# Patient Record
Sex: Male | Born: 1955
Health system: Southern US, Community
[De-identification: ages and names within clinical notes are randomized; demographics above are authoritative.]

## PROBLEM LIST (undated history)

## (undated) DIAGNOSIS — M549 Dorsalgia, unspecified: Secondary | ICD-10-CM

## (undated) DIAGNOSIS — G4700 Insomnia, unspecified: Secondary | ICD-10-CM

## (undated) DIAGNOSIS — M1990 Unspecified osteoarthritis, unspecified site: Secondary | ICD-10-CM

## (undated) DIAGNOSIS — F17200 Nicotine dependence, unspecified, uncomplicated: Secondary | ICD-10-CM

## (undated) DIAGNOSIS — R6881 Early satiety: Secondary | ICD-10-CM

## (undated) DIAGNOSIS — R739 Hyperglycemia, unspecified: Secondary | ICD-10-CM

## (undated) DIAGNOSIS — Z86711 Personal history of pulmonary embolism: Secondary | ICD-10-CM

## (undated) DIAGNOSIS — R51 Headache: Secondary | ICD-10-CM

## (undated) DIAGNOSIS — R609 Edema, unspecified: Secondary | ICD-10-CM

## (undated) DIAGNOSIS — R635 Abnormal weight gain: Secondary | ICD-10-CM

## (undated) DIAGNOSIS — G8929 Other chronic pain: Secondary | ICD-10-CM

## (undated) DIAGNOSIS — N400 Enlarged prostate without lower urinary tract symptoms: Secondary | ICD-10-CM

## (undated) DIAGNOSIS — G629 Polyneuropathy, unspecified: Secondary | ICD-10-CM

## (undated) DIAGNOSIS — R634 Abnormal weight loss: Secondary | ICD-10-CM

## (undated) DIAGNOSIS — D72829 Elevated white blood cell count, unspecified: Secondary | ICD-10-CM

## (undated) DIAGNOSIS — B3781 Candidal esophagitis: Secondary | ICD-10-CM

## (undated) DIAGNOSIS — Z227 Latent tuberculosis: Secondary | ICD-10-CM

## (undated) DIAGNOSIS — R519 Headache, unspecified: Secondary | ICD-10-CM

## (undated) DIAGNOSIS — R61 Generalized hyperhidrosis: Secondary | ICD-10-CM

## (undated) DIAGNOSIS — R7309 Other abnormal glucose: Secondary | ICD-10-CM

## (undated) HISTORY — PX: HERNIA REPAIR: SHX51

## (undated) HISTORY — PX: DG SHOULDER RIGHT COMPLETE: HXRAD1612

## (undated) HISTORY — DX: Elevated white blood cell count, unspecified: D72.829

## (undated) HISTORY — PX: HAND SURGERY: SHX662

## (undated) HISTORY — PX: WISDOM TOOTH EXTRACTION: SHX21

## (undated) HISTORY — PX: BRAIN SURGERY: SHX531

## (undated) HISTORY — DX: Abnormal weight loss: R63.4

## (undated) HISTORY — PX: BACK SURGERY: SHX140

## (undated) HISTORY — DX: Abnormal weight gain: R63.5

## (undated) HISTORY — PX: CERVICAL FUSION: SHX112

## (undated) HISTORY — PX: PROSTATE SURGERY: SHX751

## (undated) HISTORY — DX: Early satiety: R68.81

## (undated) HISTORY — DX: Headache, unspecified: R51.9

## (undated) HISTORY — DX: Hyperglycemia, unspecified: R73.9

## (undated) HISTORY — DX: Benign prostatic hyperplasia without lower urinary tract symptoms: N40.0

## (undated) HISTORY — DX: Headache: R51

## (undated) HISTORY — DX: Latent tuberculosis: Z22.7

## (undated) HISTORY — DX: Other abnormal glucose: R73.09

## (undated) HISTORY — PX: KNEE ARTHROSCOPY: SHX127

## (undated) HISTORY — DX: Insomnia, unspecified: G47.00

## (undated) HISTORY — DX: Dorsalgia, unspecified: M54.9

## (undated) HISTORY — DX: Generalized hyperhidrosis: R61

## (undated) HISTORY — DX: Edema, unspecified: R60.9

## (undated) HISTORY — DX: Polyneuropathy, unspecified: G62.9

## (undated) HISTORY — DX: Candidal esophagitis: B37.81

## (undated) HISTORY — DX: Nicotine dependence, unspecified, uncomplicated: F17.200

## (undated) HISTORY — DX: Other chronic pain: G89.29

---

## 2005-10-05 ENCOUNTER — Ambulatory Visit: Payer: Self-pay | Admitting: Internal Medicine

## 2005-10-23 ENCOUNTER — Ambulatory Visit: Payer: Self-pay | Admitting: Internal Medicine

## 2010-04-15 ENCOUNTER — Ambulatory Visit: Payer: Self-pay | Admitting: Family Medicine

## 2011-05-20 ENCOUNTER — Other Ambulatory Visit: Payer: Self-pay | Admitting: Psychiatry

## 2011-05-20 DIAGNOSIS — G970 Cerebrospinal fluid leak from spinal puncture: Secondary | ICD-10-CM

## 2011-06-24 ENCOUNTER — Ambulatory Visit
Admission: RE | Admit: 2011-06-24 | Discharge: 2011-06-24 | Disposition: A | Discharge status: Home / Self Care | Payer: Medicare Other | Source: Ambulatory Visit | Attending: Psychiatry | Admitting: Psychiatry

## 2011-06-24 DIAGNOSIS — G970 Cerebrospinal fluid leak from spinal puncture: Secondary | ICD-10-CM

## 2011-06-24 MED ORDER — GADOBENATE DIMEGLUMINE 529 MG/ML IV SOLN
17.0000 mL | Freq: Once | INTRAVENOUS | Status: AC | PRN
  Administered 2011-06-24: 17 mL via INTRAVENOUS

## 2011-09-01 DIAGNOSIS — M961 Postlaminectomy syndrome, not elsewhere classified: Secondary | ICD-10-CM | POA: Insufficient documentation

## 2011-11-18 DIAGNOSIS — M1710 Unilateral primary osteoarthritis, unspecified knee: Secondary | ICD-10-CM | POA: Insufficient documentation

## 2012-07-17 DIAGNOSIS — G44009 Cluster headache syndrome, unspecified, not intractable: Secondary | ICD-10-CM | POA: Insufficient documentation

## 2012-09-29 DIAGNOSIS — M549 Dorsalgia, unspecified: Secondary | ICD-10-CM | POA: Insufficient documentation

## 2012-09-29 DIAGNOSIS — G8929 Other chronic pain: Secondary | ICD-10-CM | POA: Insufficient documentation

## 2012-09-29 DIAGNOSIS — G4700 Insomnia, unspecified: Secondary | ICD-10-CM | POA: Insufficient documentation

## 2012-10-04 DIAGNOSIS — E291 Testicular hypofunction: Secondary | ICD-10-CM | POA: Insufficient documentation

## 2012-10-08 DIAGNOSIS — M546 Pain in thoracic spine: Secondary | ICD-10-CM | POA: Insufficient documentation

## 2013-05-29 ENCOUNTER — Ambulatory Visit: Payer: Self-pay | Admitting: Pain Medicine

## 2013-09-25 DIAGNOSIS — Z79899 Other long term (current) drug therapy: Secondary | ICD-10-CM | POA: Insufficient documentation

## 2013-09-25 DIAGNOSIS — Z79891 Long term (current) use of opiate analgesic: Secondary | ICD-10-CM | POA: Insufficient documentation

## 2013-09-27 ENCOUNTER — Emergency Department: Payer: Self-pay | Admitting: Emergency Medicine

## 2013-09-28 ENCOUNTER — Emergency Department: Payer: Self-pay | Admitting: Emergency Medicine

## 2013-10-01 ENCOUNTER — Emergency Department: Payer: Self-pay | Admitting: Emergency Medicine

## 2013-10-01 LAB — URINALYSIS, COMPLETE
Bacteria: NONE SEEN
Bilirubin,UR: NEGATIVE
Blood: NEGATIVE
Glucose,UR: 50 mg/dL (ref 0–75)
Hyaline Cast: 3
Ketone: NEGATIVE
Leukocyte Esterase: NEGATIVE
Nitrite: NEGATIVE
Ph: 5 (ref 4.5–8.0)
Protein: 30
RBC,UR: 4 /HPF (ref 0–5)
Specific Gravity: 1.029 (ref 1.003–1.030)
Squamous Epithelial: NONE SEEN
WBC UR: 11 /HPF (ref 0–5)

## 2013-10-01 LAB — COMPREHENSIVE METABOLIC PANEL WITH GFR
Albumin: 3.6 g/dL
Alkaline Phosphatase: 57 U/L
Anion Gap: 7
BUN: 21 mg/dL — ABNORMAL HIGH
Bilirubin,Total: 0.4 mg/dL
Calcium, Total: 9.2 mg/dL
Chloride: 107 mmol/L
Co2: 23 mmol/L
Creatinine: 1 mg/dL
EGFR (African American): >60
EGFR (Non-African Amer.): >60
Glucose: 155 mg/dL — ABNORMAL HIGH
Osmolality: 280
Potassium: 4.2 mmol/L
SGOT(AST): 34 U/L
SGPT (ALT): 59 U/L
Sodium: 137 mmol/L
Total Protein: 6.8 g/dL

## 2013-10-01 LAB — CBC
HCT: 44.6 %
HGB: 15.6 g/dL
MCH: 34.5 pg — ABNORMAL HIGH
MCHC: 35 g/dL
MCV: 99 fL
Platelet: 211 x10 3/mm 3
RBC: 4.51 x10 6/mm 3
RDW: 14.6 % — ABNORMAL HIGH
WBC: 10.3 x10 3/mm 3

## 2013-10-01 LAB — LIPASE, BLOOD: Lipase: 165 U/L

## 2013-10-03 LAB — URINE CULTURE

## 2013-10-26 ENCOUNTER — Ambulatory Visit: Payer: Self-pay | Admitting: Gastroenterology

## 2013-11-01 DIAGNOSIS — B3781 Candidal esophagitis: Secondary | ICD-10-CM | POA: Insufficient documentation

## 2013-11-01 DIAGNOSIS — R739 Hyperglycemia, unspecified: Secondary | ICD-10-CM

## 2013-11-01 DIAGNOSIS — D72829 Elevated white blood cell count, unspecified: Secondary | ICD-10-CM | POA: Insufficient documentation

## 2013-11-01 DIAGNOSIS — R7309 Other abnormal glucose: Secondary | ICD-10-CM | POA: Insufficient documentation

## 2013-11-01 DIAGNOSIS — H519 Unspecified disorder of binocular movement: Secondary | ICD-10-CM | POA: Insufficient documentation

## 2013-11-01 HISTORY — DX: Hyperglycemia, unspecified: R73.9

## 2013-11-01 HISTORY — DX: Other abnormal glucose: R73.09

## 2013-11-01 HISTORY — DX: Candidal esophagitis: B37.81

## 2013-11-01 HISTORY — DX: Elevated white blood cell count, unspecified: D72.829

## 2013-11-20 ENCOUNTER — Ambulatory Visit: Payer: Self-pay | Admitting: Pain Medicine

## 2013-11-21 ENCOUNTER — Ambulatory Visit: Payer: Self-pay | Admitting: Pain Medicine

## 2013-12-22 ENCOUNTER — Ambulatory Visit: Payer: Self-pay | Admitting: Pain Medicine

## 2014-01-02 ENCOUNTER — Ambulatory Visit: Payer: Self-pay | Admitting: Pain Medicine

## 2014-01-10 ENCOUNTER — Ambulatory Visit: Payer: Self-pay | Admitting: Ophthalmology

## 2014-01-17 ENCOUNTER — Ambulatory Visit: Payer: Self-pay | Admitting: Pain Medicine

## 2014-01-18 ENCOUNTER — Other Ambulatory Visit: Payer: Self-pay | Admitting: Pain Medicine

## 2014-02-09 ENCOUNTER — Ambulatory Visit: Payer: Self-pay | Admitting: Pain Medicine

## 2014-03-23 ENCOUNTER — Ambulatory Visit: Payer: Self-pay | Admitting: Pain Medicine

## 2014-03-28 DIAGNOSIS — R609 Edema, unspecified: Secondary | ICD-10-CM | POA: Insufficient documentation

## 2014-03-28 DIAGNOSIS — M2410 Other articular cartilage disorders, unspecified site: Secondary | ICD-10-CM | POA: Insufficient documentation

## 2014-03-28 DIAGNOSIS — S83289A Other tear of lateral meniscus, current injury, unspecified knee, initial encounter: Secondary | ICD-10-CM | POA: Insufficient documentation

## 2014-03-28 HISTORY — DX: Edema, unspecified: R60.9

## 2014-04-09 DIAGNOSIS — Z9229 Personal history of other drug therapy: Secondary | ICD-10-CM | POA: Insufficient documentation

## 2014-04-09 DIAGNOSIS — I999 Unspecified disorder of circulatory system: Secondary | ICD-10-CM | POA: Insufficient documentation

## 2014-04-12 DIAGNOSIS — M47816 Spondylosis without myelopathy or radiculopathy, lumbar region: Secondary | ICD-10-CM | POA: Insufficient documentation

## 2014-04-12 DIAGNOSIS — G894 Chronic pain syndrome: Secondary | ICD-10-CM | POA: Insufficient documentation

## 2014-04-12 DIAGNOSIS — M5417 Radiculopathy, lumbosacral region: Secondary | ICD-10-CM | POA: Insufficient documentation

## 2014-04-12 DIAGNOSIS — Z9889 Other specified postprocedural states: Secondary | ICD-10-CM | POA: Insufficient documentation

## 2014-04-12 DIAGNOSIS — M961 Postlaminectomy syndrome, not elsewhere classified: Secondary | ICD-10-CM | POA: Insufficient documentation

## 2014-04-18 ENCOUNTER — Ambulatory Visit: Payer: Self-pay | Admitting: Pain Medicine

## 2014-07-26 DIAGNOSIS — M129 Arthropathy, unspecified: Secondary | ICD-10-CM | POA: Insufficient documentation

## 2014-08-02 DIAGNOSIS — K429 Umbilical hernia without obstruction or gangrene: Secondary | ICD-10-CM | POA: Insufficient documentation

## 2014-08-02 DIAGNOSIS — Q792 Exomphalos: Secondary | ICD-10-CM | POA: Insufficient documentation

## 2014-10-22 DIAGNOSIS — F32A Depression, unspecified: Secondary | ICD-10-CM | POA: Insufficient documentation

## 2014-10-22 DIAGNOSIS — F329 Major depressive disorder, single episode, unspecified: Secondary | ICD-10-CM | POA: Insufficient documentation

## 2015-02-06 ENCOUNTER — Ambulatory Visit: Payer: Self-pay | Admitting: Ophthalmology

## 2015-04-15 DIAGNOSIS — N6320 Unspecified lump in the left breast, unspecified quadrant: Secondary | ICD-10-CM | POA: Insufficient documentation

## 2015-09-25 DIAGNOSIS — E2749 Other adrenocortical insufficiency: Secondary | ICD-10-CM | POA: Insufficient documentation

## 2015-12-23 DIAGNOSIS — G44029 Chronic cluster headache, not intractable: Secondary | ICD-10-CM | POA: Diagnosis not present

## 2015-12-23 DIAGNOSIS — G894 Chronic pain syndrome: Secondary | ICD-10-CM | POA: Diagnosis not present

## 2015-12-23 DIAGNOSIS — M545 Low back pain: Secondary | ICD-10-CM | POA: Diagnosis not present

## 2015-12-23 DIAGNOSIS — M542 Cervicalgia: Secondary | ICD-10-CM | POA: Diagnosis not present

## 2016-01-06 DIAGNOSIS — G894 Chronic pain syndrome: Secondary | ICD-10-CM | POA: Diagnosis not present

## 2016-01-06 DIAGNOSIS — F1120 Opioid dependence, uncomplicated: Secondary | ICD-10-CM | POA: Diagnosis not present

## 2016-01-06 DIAGNOSIS — M545 Low back pain: Secondary | ICD-10-CM | POA: Diagnosis not present

## 2016-01-06 DIAGNOSIS — R51 Headache: Secondary | ICD-10-CM | POA: Diagnosis not present

## 2016-01-06 DIAGNOSIS — M542 Cervicalgia: Secondary | ICD-10-CM | POA: Diagnosis not present

## 2016-01-06 DIAGNOSIS — G890 Central pain syndrome: Secondary | ICD-10-CM | POA: Diagnosis not present

## 2016-01-06 DIAGNOSIS — Z79891 Long term (current) use of opiate analgesic: Secondary | ICD-10-CM | POA: Diagnosis not present

## 2016-01-21 DIAGNOSIS — G44029 Chronic cluster headache, not intractable: Secondary | ICD-10-CM | POA: Diagnosis not present

## 2016-01-21 DIAGNOSIS — M545 Low back pain: Secondary | ICD-10-CM | POA: Diagnosis not present

## 2016-01-21 DIAGNOSIS — F1120 Opioid dependence, uncomplicated: Secondary | ICD-10-CM | POA: Diagnosis not present

## 2016-01-21 DIAGNOSIS — G890 Central pain syndrome: Secondary | ICD-10-CM | POA: Diagnosis not present

## 2016-01-21 DIAGNOSIS — R51 Headache: Secondary | ICD-10-CM | POA: Diagnosis not present

## 2016-01-21 DIAGNOSIS — Z79891 Long term (current) use of opiate analgesic: Secondary | ICD-10-CM | POA: Diagnosis not present

## 2016-01-21 DIAGNOSIS — G894 Chronic pain syndrome: Secondary | ICD-10-CM | POA: Diagnosis not present

## 2016-01-21 DIAGNOSIS — M542 Cervicalgia: Secondary | ICD-10-CM | POA: Diagnosis not present

## 2016-01-22 DIAGNOSIS — G44029 Chronic cluster headache, not intractable: Secondary | ICD-10-CM | POA: Diagnosis not present

## 2016-01-22 DIAGNOSIS — M1990 Unspecified osteoarthritis, unspecified site: Secondary | ICD-10-CM | POA: Insufficient documentation

## 2016-01-22 DIAGNOSIS — G629 Polyneuropathy, unspecified: Secondary | ICD-10-CM | POA: Diagnosis not present

## 2016-01-22 DIAGNOSIS — F1120 Opioid dependence, uncomplicated: Secondary | ICD-10-CM | POA: Diagnosis not present

## 2016-01-22 DIAGNOSIS — G4719 Other hypersomnia: Secondary | ICD-10-CM | POA: Diagnosis not present

## 2016-01-22 DIAGNOSIS — G4700 Insomnia, unspecified: Secondary | ICD-10-CM | POA: Diagnosis not present

## 2016-01-22 DIAGNOSIS — E291 Testicular hypofunction: Secondary | ICD-10-CM | POA: Diagnosis not present

## 2016-01-25 DIAGNOSIS — F17200 Nicotine dependence, unspecified, uncomplicated: Secondary | ICD-10-CM | POA: Diagnosis not present

## 2016-01-25 DIAGNOSIS — B349 Viral infection, unspecified: Secondary | ICD-10-CM | POA: Diagnosis not present

## 2016-01-25 DIAGNOSIS — E119 Type 2 diabetes mellitus without complications: Secondary | ICD-10-CM | POA: Diagnosis not present

## 2016-01-25 DIAGNOSIS — R61 Generalized hyperhidrosis: Secondary | ICD-10-CM | POA: Diagnosis not present

## 2016-01-25 DIAGNOSIS — Z86718 Personal history of other venous thrombosis and embolism: Secondary | ICD-10-CM | POA: Diagnosis not present

## 2016-01-25 DIAGNOSIS — G960 Cerebrospinal fluid leak: Secondary | ICD-10-CM | POA: Diagnosis not present

## 2016-02-04 DIAGNOSIS — M542 Cervicalgia: Secondary | ICD-10-CM | POA: Diagnosis not present

## 2016-02-04 DIAGNOSIS — G894 Chronic pain syndrome: Secondary | ICD-10-CM | POA: Diagnosis not present

## 2016-02-04 DIAGNOSIS — Z79891 Long term (current) use of opiate analgesic: Secondary | ICD-10-CM | POA: Diagnosis not present

## 2016-02-04 DIAGNOSIS — M545 Low back pain: Secondary | ICD-10-CM | POA: Diagnosis not present

## 2016-02-04 DIAGNOSIS — G44029 Chronic cluster headache, not intractable: Secondary | ICD-10-CM | POA: Diagnosis not present

## 2016-02-13 DIAGNOSIS — R1013 Epigastric pain: Secondary | ICD-10-CM | POA: Diagnosis not present

## 2016-02-13 DIAGNOSIS — Z23 Encounter for immunization: Secondary | ICD-10-CM | POA: Diagnosis not present

## 2016-02-13 DIAGNOSIS — M2550 Pain in unspecified joint: Secondary | ICD-10-CM | POA: Diagnosis not present

## 2016-02-13 DIAGNOSIS — G609 Hereditary and idiopathic neuropathy, unspecified: Secondary | ICD-10-CM | POA: Diagnosis not present

## 2016-02-13 DIAGNOSIS — G8929 Other chronic pain: Secondary | ICD-10-CM | POA: Diagnosis not present

## 2016-02-13 DIAGNOSIS — R899 Unspecified abnormal finding in specimens from other organs, systems and tissues: Secondary | ICD-10-CM | POA: Diagnosis not present

## 2016-02-13 DIAGNOSIS — I10 Essential (primary) hypertension: Secondary | ICD-10-CM | POA: Diagnosis not present

## 2016-02-13 DIAGNOSIS — R6881 Early satiety: Secondary | ICD-10-CM | POA: Diagnosis not present

## 2016-03-03 DIAGNOSIS — M79661 Pain in right lower leg: Secondary | ICD-10-CM | POA: Diagnosis not present

## 2016-03-03 DIAGNOSIS — G894 Chronic pain syndrome: Secondary | ICD-10-CM | POA: Diagnosis not present

## 2016-03-03 DIAGNOSIS — M79662 Pain in left lower leg: Secondary | ICD-10-CM | POA: Diagnosis not present

## 2016-03-03 DIAGNOSIS — Z79891 Long term (current) use of opiate analgesic: Secondary | ICD-10-CM | POA: Diagnosis not present

## 2016-03-03 DIAGNOSIS — M545 Low back pain: Secondary | ICD-10-CM | POA: Diagnosis not present

## 2016-03-05 DIAGNOSIS — R200 Anesthesia of skin: Secondary | ICD-10-CM | POA: Insufficient documentation

## 2016-03-05 DIAGNOSIS — R202 Paresthesia of skin: Secondary | ICD-10-CM

## 2016-03-05 DIAGNOSIS — M545 Low back pain: Secondary | ICD-10-CM | POA: Diagnosis not present

## 2016-03-05 DIAGNOSIS — G8929 Other chronic pain: Secondary | ICD-10-CM | POA: Diagnosis not present

## 2016-03-05 DIAGNOSIS — E1140 Type 2 diabetes mellitus with diabetic neuropathy, unspecified: Secondary | ICD-10-CM | POA: Insufficient documentation

## 2016-03-16 DIAGNOSIS — R110 Nausea: Secondary | ICD-10-CM | POA: Diagnosis not present

## 2016-03-16 DIAGNOSIS — J189 Pneumonia, unspecified organism: Secondary | ICD-10-CM | POA: Diagnosis not present

## 2016-03-17 DIAGNOSIS — R6881 Early satiety: Secondary | ICD-10-CM | POA: Diagnosis not present

## 2016-03-17 DIAGNOSIS — R110 Nausea: Secondary | ICD-10-CM | POA: Diagnosis not present

## 2016-03-17 DIAGNOSIS — R140 Abdominal distension (gaseous): Secondary | ICD-10-CM | POA: Diagnosis not present

## 2016-03-25 DIAGNOSIS — R5383 Other fatigue: Secondary | ICD-10-CM | POA: Insufficient documentation

## 2016-03-25 DIAGNOSIS — M1990 Unspecified osteoarthritis, unspecified site: Secondary | ICD-10-CM | POA: Diagnosis not present

## 2016-03-25 DIAGNOSIS — Z201 Contact with and (suspected) exposure to tuberculosis: Secondary | ICD-10-CM | POA: Diagnosis not present

## 2016-03-25 DIAGNOSIS — R5382 Chronic fatigue, unspecified: Secondary | ICD-10-CM | POA: Diagnosis not present

## 2016-03-26 DIAGNOSIS — R1013 Epigastric pain: Secondary | ICD-10-CM | POA: Diagnosis not present

## 2016-03-26 DIAGNOSIS — J189 Pneumonia, unspecified organism: Secondary | ICD-10-CM | POA: Diagnosis not present

## 2016-03-26 DIAGNOSIS — G8929 Other chronic pain: Secondary | ICD-10-CM | POA: Diagnosis not present

## 2016-03-26 DIAGNOSIS — R110 Nausea: Secondary | ICD-10-CM | POA: Diagnosis not present

## 2016-03-26 DIAGNOSIS — M2550 Pain in unspecified joint: Secondary | ICD-10-CM | POA: Diagnosis not present

## 2016-03-30 DIAGNOSIS — G894 Chronic pain syndrome: Secondary | ICD-10-CM | POA: Diagnosis not present

## 2016-03-30 DIAGNOSIS — Z79891 Long term (current) use of opiate analgesic: Secondary | ICD-10-CM | POA: Diagnosis not present

## 2016-03-30 DIAGNOSIS — M545 Low back pain: Secondary | ICD-10-CM | POA: Diagnosis not present

## 2016-03-30 DIAGNOSIS — M79662 Pain in left lower leg: Secondary | ICD-10-CM | POA: Diagnosis not present

## 2016-03-30 DIAGNOSIS — M79661 Pain in right lower leg: Secondary | ICD-10-CM | POA: Diagnosis not present

## 2016-03-31 ENCOUNTER — Emergency Department
Admission: EM | Admit: 2016-03-31 | Discharge: 2016-03-31 | Disposition: A | Discharge status: Home / Self Care | Payer: PPO | Attending: Emergency Medicine | Admitting: Emergency Medicine

## 2016-03-31 ENCOUNTER — Encounter: Payer: Self-pay | Admitting: Emergency Medicine

## 2016-03-31 DIAGNOSIS — R700 Elevated erythrocyte sedimentation rate: Secondary | ICD-10-CM | POA: Diagnosis not present

## 2016-03-31 DIAGNOSIS — F1193 Opioid use, unspecified with withdrawal: Secondary | ICD-10-CM | POA: Diagnosis not present

## 2016-03-31 DIAGNOSIS — F1123 Opioid dependence with withdrawal: Secondary | ICD-10-CM | POA: Diagnosis not present

## 2016-03-31 DIAGNOSIS — M2550 Pain in unspecified joint: Secondary | ICD-10-CM | POA: Insufficient documentation

## 2016-03-31 DIAGNOSIS — M1990 Unspecified osteoarthritis, unspecified site: Secondary | ICD-10-CM | POA: Diagnosis not present

## 2016-03-31 DIAGNOSIS — Z760 Encounter for issue of repeat prescription: Secondary | ICD-10-CM | POA: Diagnosis not present

## 2016-03-31 DIAGNOSIS — R768 Other specified abnormal immunological findings in serum: Secondary | ICD-10-CM | POA: Diagnosis not present

## 2016-03-31 DIAGNOSIS — Z201 Contact with and (suspected) exposure to tuberculosis: Secondary | ICD-10-CM | POA: Diagnosis not present

## 2016-03-31 DIAGNOSIS — G8929 Other chronic pain: Secondary | ICD-10-CM | POA: Diagnosis not present

## 2016-03-31 DIAGNOSIS — R5382 Chronic fatigue, unspecified: Secondary | ICD-10-CM | POA: Diagnosis not present

## 2016-03-31 DIAGNOSIS — F17200 Nicotine dependence, unspecified, uncomplicated: Secondary | ICD-10-CM | POA: Diagnosis not present

## 2016-03-31 DIAGNOSIS — M5442 Lumbago with sciatica, left side: Secondary | ICD-10-CM | POA: Diagnosis not present

## 2016-03-31 HISTORY — DX: Unspecified osteoarthritis, unspecified site: M19.90

## 2016-03-31 MED ORDER — ONDANSETRON HCL 4 MG PO TABS: 4.0000 mg | ORAL_TABLET | Freq: Three times a day (TID) | ORAL | Status: DC | PRN

## 2016-03-31 MED ORDER — BACLOFEN 10 MG PO TABS
5.0000 mg | ORAL_TABLET | Freq: Once | ORAL | Status: AC
  Administered 2016-03-31: 5 mg via ORAL
  Filled 2016-03-31: qty 1

## 2016-03-31 MED ORDER — BACLOFEN 10 MG PO TABS: 10.0000 mg | ORAL_TABLET | Freq: Two times a day (BID) | ORAL | Status: DC | PRN

## 2016-03-31 NOTE — Discharge Instructions (Signed)
Please seek medical attention for any high fevers, chest pain, shortness of breath, change in behavior, persistent vomiting, bloody stool or any other new or concerning symptoms. ° ° °Opioid Withdrawal °Opioids are a group of narcotic drugs. They include the street drug heroin. They also include pain medicines, such as morphine, hydrocodone, oxycodone, and fentanyl. Opioid withdrawal is a group of characteristic physical and mental signs and symptoms. It typically occurs if you have been using opioids daily for several weeks or longer and stop using or rapidly decrease use. Opioid withdrawal can also occur if you have used opioids daily for a long time and are given a medicine to block the effect.  °SIGNS AND SYMPTOMS °Opioid withdrawal includes three or more of the following symptoms:  °· Depressed, anxious, or irritable mood. °· Nausea or vomiting. °· Muscle aches or spasms.   °· Watery eyes.    °· Runny nose. °· Dilated pupils, sweating, or hairs standing on end. °· Diarrhea or intestinal cramping. °· Yawning.   °· Fever. °· Increased blood pressure. °· Fast pulse. °· Restlessness or trouble sleeping. °These signs and symptoms occur within several hours of stopping or reducing short-acting opioids, such as heroin. They can occur within 3 days of stopping or reducing long-acting opioids, such as methadone. Withdrawal begins within minutes of receiving a drug that blocks the effects of opioids, such as naltrexone or naloxone. °DIAGNOSIS  °Opioid use disorder is diagnosed by your health care provider. You will be asked about your symptoms, drug and alcohol use, medical history, and use of medicines. A physical exam may be done. Lab tests may be ordered. Your health care provider may have you see a mental health professional.  °TREATMENT  °The treatment for opioid withdrawal is usually provided by medical doctors with special training in substance use disorders (addiction specialists). The following medicines may be  included in treatment: °· Opioids given in place of the abused opioid. They turn on opioid receptors in the brain and lessen or prevent withdrawal symptoms. They are gradually decreased (opioid substitution and taper). °· Non-opioids that can lessen certain opioid withdrawal symptoms. They may be used alone or with opioid substitution and taper. °Successful long-term recovery usually requires medicine, counseling, and group support. °HOME CARE INSTRUCTIONS  °· Take medicines only as directed by your health care provider. °· Check with your health care provider before starting new medicines. °· Keep all follow-up visits as directed by your health care provider. °SEEK MEDICAL CARE IF: °· You are not able to take your medicines as directed. °· Your symptoms get worse. °· You relapse. °SEEK IMMEDIATE MEDICAL CARE IF: °· You have serious thoughts about hurting yourself or others. °· You have a seizure. °· You lose consciousness. °  °This information is not intended to replace advice given to you by your health care provider. Make sure you discuss any questions you have with your health care provider. °  °Document Released: 12/03/2003 Document Revised: 12/21/2014 Document Reviewed: 12/13/2013 °Elsevier Interactive Patient Education ©2016 [employer] ° °

## 2016-03-31 NOTE — ED Notes (Signed)
Pt reports to ED after calling PCP due to being out of pain medication. Pt reports he has been taking 15 mg oxycodone 6x a day, been without for 2 days. Pt denies SI and HI.

## 2016-03-31 NOTE — ED Provider Notes (Signed)
[facility] Emergency Department Provider Note    ____________________________________________  Time seen: ~1950  I have reviewed the triage vital signs and the nursing notes.   HISTORY  Chief Complaint Medication Refill   History limited by: Not Limited   HPI Gregory Cervantes is a 60 y.o. male who presents to the emergency department today because of concerns for symptoms related to opiate withdrawal. Patient states that he is on chronic opioids. He states that he is been out of his opiates. He states that his primary care doctor will not refill them because he missed a pill count. He hopes to get them refilled this Friday. He thinks at this point is over the nausea and vomiting stage of withdrawals. He does state that he has full body aches. He states that when he talked to the nurse earlier he did mention wanting to die although he states that he was not serious. He states it was simply the pain. He denies any medical complaints or fevers.   Past Medical History  Diagnosis Date  . Arthritis     There are no active problems to display for this patient.   History reviewed. No pertinent past surgical history.  Allergies Review of patient's allergies indicates no known allergies.  History reviewed. No pertinent family history.  Social History Social History  Substance Use Topics  . Smoking status: Current Every Day Smoker  . Smokeless tobacco: None  . Alcohol Use: No    Review of Systems  Constitutional: Negative for fever. Cardiovascular: Negative for chest pain. Respiratory: Negative for shortness of breath. Gastrointestinal: Negative for abdominal pain, vomiting and diarrhea. Neurological: Negative for headaches, focal weakness or numbness.  10-point ROS otherwise negative.  ____________________________________________   PHYSICAL EXAM:  VITAL SIGNS: ED Triage Vitals  Enc Vitals Group     BP 03/31/16 1914 129/87 mmHg     Pulse  Rate 03/31/16 1914 106     Resp 03/31/16 1914 20     Temp 03/31/16 1914 97.8 F (36.6 C)     Temp Source 03/31/16 1914 Oral     SpO2 03/31/16 1914 97 %     Weight 03/31/16 1914 185 lb (83.915 kg)     Height 03/31/16 1914 5\' 11"  (1.803 m)   Constitutional: Alert and oriented. Well appearing and in no distress. Eyes: Conjunctivae are normal. PERRL. Normal extraocular movements. ENT   Head: Normocephalic and atraumatic.   Nose: No congestion/rhinnorhea.   Mouth/Throat: Mucous membranes are moist.   Neck: No stridor. Hematological/Lymphatic/Immunilogical: No cervical lymphadenopathy. Cardiovascular: Normal rate, regular rhythm.  No murmurs, rubs, or gallops. Respiratory: Normal respiratory effort without tachypnea nor retractions. Breath sounds are clear and equal bilaterally. No wheezes/rales/rhonchi. Gastrointestinal: Soft and nontender. No distention.  Genitourinary: Deferred Musculoskeletal: Normal range of motion in all extremities. No joint effusions.  No lower extremity tenderness nor edema. Neurologic:  Normal speech and language. No gross focal neurologic deficits are appreciated.  Skin:  Skin is warm, dry and intact. No rash noted. Psychiatric: Mood and affect are normal. Speech and behavior are normal. Patient exhibits appropriate insight and judgment.  ____________________________________________    LABS (pertinent positives/negatives)  None  ____________________________________________   EKG  None  ____________________________________________    RADIOLOGY  None  ____________________________________________   PROCEDURES  Procedure(s) performed: None  Critical Care performed: No  ____________________________________________   INITIAL IMPRESSION / ASSESSMENT AND PLAN / ED COURSE  Pertinent labs & imaging results that were available during my care of the  patient were reviewed by me and considered in my medical decision making (see chart  for details).  Patient presented to the emergency department today because of concerns for pain in relation to opiate withdrawal. On exam patient does not appear in any distress. I discussed with patient that we would not be able to prescribe opiate medication however will give him medication to help with the symptoms of opiate withdrawal. In regards his comments to the nurse about wanting to hurt himself but does appear that this was said in a period of frustration and pain. The patient denies any SI or thoughts of harming himself this point. He states that he was not serious when he said that earlier. Will discharge home to follow-up with primary care.  ____________________________________________   FINAL CLINICAL IMPRESSION(S) / ED DIAGNOSES  Final diagnoses:  Opioid withdrawal ([place])     Nance Pear, MD 03/31/16 2103

## 2016-04-01 ENCOUNTER — Other Ambulatory Visit: Payer: Self-pay | Admitting: Internal Medicine

## 2016-04-01 DIAGNOSIS — M5442 Lumbago with sciatica, left side: Secondary | ICD-10-CM

## 2016-04-03 DIAGNOSIS — Z79891 Long term (current) use of opiate analgesic: Secondary | ICD-10-CM | POA: Diagnosis not present

## 2016-04-03 DIAGNOSIS — M79662 Pain in left lower leg: Secondary | ICD-10-CM | POA: Diagnosis not present

## 2016-04-03 DIAGNOSIS — M545 Low back pain: Secondary | ICD-10-CM | POA: Diagnosis not present

## 2016-04-03 DIAGNOSIS — M79661 Pain in right lower leg: Secondary | ICD-10-CM | POA: Diagnosis not present

## 2016-04-03 DIAGNOSIS — G894 Chronic pain syndrome: Secondary | ICD-10-CM | POA: Diagnosis not present

## 2016-04-09 DIAGNOSIS — F1123 Opioid dependence with withdrawal: Secondary | ICD-10-CM | POA: Diagnosis not present

## 2016-04-09 DIAGNOSIS — G932 Benign intracranial hypertension: Secondary | ICD-10-CM | POA: Diagnosis not present

## 2016-04-09 DIAGNOSIS — G4709 Other insomnia: Secondary | ICD-10-CM | POA: Diagnosis not present

## 2016-04-21 DIAGNOSIS — M546 Pain in thoracic spine: Secondary | ICD-10-CM | POA: Diagnosis not present

## 2016-04-22 ENCOUNTER — Ambulatory Visit
Admission: RE | Admit: 2016-04-22 | Discharge: 2016-04-22 | Disposition: A | Discharge status: Home / Self Care | Payer: PPO | Source: Ambulatory Visit | Attending: Internal Medicine | Admitting: Internal Medicine

## 2016-04-22 DIAGNOSIS — M5442 Lumbago with sciatica, left side: Secondary | ICD-10-CM | POA: Diagnosis not present

## 2016-04-22 DIAGNOSIS — M47896 Other spondylosis, lumbar region: Secondary | ICD-10-CM | POA: Insufficient documentation

## 2016-04-22 DIAGNOSIS — G8929 Other chronic pain: Secondary | ICD-10-CM | POA: Diagnosis not present

## 2016-04-22 DIAGNOSIS — M5126 Other intervertebral disc displacement, lumbar region: Secondary | ICD-10-CM | POA: Insufficient documentation

## 2016-04-22 DIAGNOSIS — M4806 Spinal stenosis, lumbar region: Secondary | ICD-10-CM | POA: Diagnosis not present

## 2016-04-22 MED ORDER — GADOBENATE DIMEGLUMINE 529 MG/ML IV SOLN
20.0000 mL | Freq: Once | INTRAVENOUS | Status: AC | PRN
  Administered 2016-04-22: 17 mL via INTRAVENOUS

## 2016-04-28 DIAGNOSIS — M25562 Pain in left knee: Secondary | ICD-10-CM | POA: Diagnosis not present

## 2016-04-28 DIAGNOSIS — Z79891 Long term (current) use of opiate analgesic: Secondary | ICD-10-CM | POA: Diagnosis not present

## 2016-04-28 DIAGNOSIS — M79661 Pain in right lower leg: Secondary | ICD-10-CM | POA: Diagnosis not present

## 2016-04-28 DIAGNOSIS — R51 Headache: Secondary | ICD-10-CM | POA: Diagnosis not present

## 2016-04-28 DIAGNOSIS — G894 Chronic pain syndrome: Secondary | ICD-10-CM | POA: Diagnosis not present

## 2016-04-28 DIAGNOSIS — M545 Low back pain: Secondary | ICD-10-CM | POA: Diagnosis not present

## 2016-04-28 DIAGNOSIS — M79662 Pain in left lower leg: Secondary | ICD-10-CM | POA: Diagnosis not present

## 2016-04-30 DIAGNOSIS — M549 Dorsalgia, unspecified: Secondary | ICD-10-CM | POA: Diagnosis not present

## 2016-04-30 DIAGNOSIS — G8929 Other chronic pain: Secondary | ICD-10-CM | POA: Diagnosis not present

## 2016-04-30 DIAGNOSIS — R768 Other specified abnormal immunological findings in serum: Secondary | ICD-10-CM | POA: Diagnosis not present

## 2016-04-30 DIAGNOSIS — M545 Low back pain: Secondary | ICD-10-CM | POA: Diagnosis not present

## 2016-05-06 DIAGNOSIS — R51 Headache: Secondary | ICD-10-CM | POA: Diagnosis not present

## 2016-05-06 DIAGNOSIS — R300 Dysuria: Secondary | ICD-10-CM | POA: Diagnosis not present

## 2016-05-06 DIAGNOSIS — F1190 Opioid use, unspecified, uncomplicated: Secondary | ICD-10-CM | POA: Diagnosis not present

## 2016-05-06 DIAGNOSIS — R61 Generalized hyperhidrosis: Secondary | ICD-10-CM | POA: Diagnosis not present

## 2016-05-13 DIAGNOSIS — R110 Nausea: Secondary | ICD-10-CM | POA: Diagnosis not present

## 2016-05-13 DIAGNOSIS — R51 Headache: Secondary | ICD-10-CM | POA: Diagnosis not present

## 2016-05-14 DIAGNOSIS — M47816 Spondylosis without myelopathy or radiculopathy, lumbar region: Secondary | ICD-10-CM | POA: Diagnosis not present

## 2016-05-14 DIAGNOSIS — F1190 Opioid use, unspecified, uncomplicated: Secondary | ICD-10-CM | POA: Diagnosis not present

## 2016-05-14 DIAGNOSIS — M4806 Spinal stenosis, lumbar region: Secondary | ICD-10-CM | POA: Diagnosis not present

## 2016-05-14 DIAGNOSIS — G894 Chronic pain syndrome: Secondary | ICD-10-CM | POA: Diagnosis not present

## 2016-05-18 ENCOUNTER — Ambulatory Visit (INDEPENDENT_AMBULATORY_CARE_PROVIDER_SITE_OTHER): Payer: Self-pay | Admitting: Family Medicine

## 2016-05-18 ENCOUNTER — Encounter: Payer: Self-pay | Admitting: Family Medicine

## 2016-05-18 VITALS — BP 113/70 | HR 79 | Temp 98.0°F | Resp 16 | Ht 71.0 in | Wt 190.0 lb

## 2016-05-18 DIAGNOSIS — G894 Chronic pain syndrome: Secondary | ICD-10-CM | POA: Diagnosis not present

## 2016-05-18 DIAGNOSIS — K089 Disorder of teeth and supporting structures, unspecified: Secondary | ICD-10-CM | POA: Insufficient documentation

## 2016-05-18 NOTE — Progress Notes (Signed)
Subjective:    Patient ID: Gregory Cervantes, male    DOB: 1956-11-03, 60 y.o.   MRN: [id number]  HPI: Gregory Cervantes is a 60 y.o. male presenting on 05/18/2016 for Establish Care   HPI  Pt presents to establish care today. Previous care provider was [facility]- he saw Dr. [name] today -last PCP visit.  Pt was informed that his insurance will not pay for 2 visits in the same day. He was offered a chance to reschedule- but was informed that our office does not do chronic pain management.  Pt opted not to return. He will not be charged for visit.      Past Medical History  Diagnosis Date  . Arthritis   . Headache   . BPH (benign prostatic hyperplasia)   . Insomnia   . Chronic back pain    Social History   Social History  . Marital Status: Divorced    Spouse Name: N/A  . Number of Children: N/A  . Years of Education: N/A   Occupational History  . Not on file.   Social History Main Topics  . Smoking status: Current Every Day Smoker -- 1.00 packs/day    Types: Cigarettes  . Smokeless tobacco: Not on file  . Alcohol Use: 0.0 oz/week    0 Standard drinks or equivalent per week     Comment: three times a year  . Drug Use: No  . Sexual Activity: Not on file   Other Topics Concern  . Not on file   Social History Narrative   No family history on file. Current Outpatient Prescriptions on File Prior to Visit  Medication Sig  . baclofen (LIORESAL) 10 MG tablet Take 1 tablet (10 mg total) by mouth 2 (two) times daily as needed for muscle spasms (pain).  . ondansetron (ZOFRAN) 4 MG tablet Take 1 tablet (4 mg total) by mouth every 8 (eight) hours as needed for nausea or vomiting.   No current facility-administered medications on file prior to visit.    Review of Systems Per HPI unless specifically indicated above     Objective:    BP 113/70 mmHg  Pulse 79  Temp(Src) 98 F (36.7 C) (Oral)  Resp 16  Ht 5\' 11"  (1.803 m)  Wt 190 lb (86.183 kg)  BMI  26.51 kg/m2  Wt Readings from Last 3 Encounters:  05/18/16 190 lb (86.183 kg)  03/31/16 185 lb (83.915 kg)    Physical Exam Results for orders placed or performed in visit on 11/20/13  Sedimentation rate  Result Value Ref Range   Erythrocyte Sed Rate 10 0-20 mm/hr      Assessment & Plan:   Problem List Items Addressed This Visit    None      Meds ordered this encounter  Medications  . tiZANidine (ZANAFLEX) 4 MG tablet    Sig: Take 4 mg by mouth at bedtime.  . citalopram (CELEXA) 40 MG tablet    Sig: Take 40 mg by mouth daily.  . hydrocortisone (CORTEF) 5 MG tablet    Sig: Take 4 tablets by mouth daily.  Marland Kitchen oxycodone (OXY-IR) 5 MG capsule    Sig: Take 5 mg by mouth every 4 (four) hours as needed.  Marland Kitchen oxyCODONE (ROXICODONE) 15 MG immediate release tablet    Sig: Take 15 mg by mouth 4 (four) times daily as needed.  . warfarin (COUMADIN) 1 MG tablet    Sig: Take 1 mg by mouth daily.  Marland Kitchen  warfarin (COUMADIN) 5 MG tablet    Sig: Take 5 mg by mouth daily.  . SUMAtriptan (IMITREX) 6 MG/0.5ML SOLN injection    Sig: Inject 0.6 mg into the muscle as needed.  Marland Kitchen oxyCODONE-acetaminophen (PERCOCET/ROXICET) 5-325 MG tablet    Sig: Take 1 tablet by mouth every 6 (six) hours.  Marland Kitchen ibuprofen (ADVIL,MOTRIN) 600 MG tablet    Sig: Take 600 mg by mouth 4 (four) times daily as needed.  . naloxone (NARCAN) 0.4 MG/ML injection    Sig: Inject 0.4 mg into the muscle as needed.  . gabapentin (NEURONTIN) 300 MG capsule    Sig: Take 900 mg by mouth 3 (three) times daily.  . QUEtiapine (SEROQUEL) 100 MG tablet    Sig: Take 100 mg by mouth at bedtime.  Marland Kitchen QUEtiapine (SEROQUEL) 25 MG tablet    Sig: Take 25 mg by mouth at bedtime.  . ALPRAZolam (XANAX) 0.5 MG tablet    Sig: Take 1 mg by mouth at bedtime.  . ALPRAZolam (XANAX) 1 MG tablet    Sig: Take 1 mg by mouth at bedtime as needed.  . Cholecalciferol (VITAMIN D3) 5000 units CAPS    Sig: Take 5,000 Units by mouth daily.  . fluticasone (FLONASE) 50  MCG/ACT nasal spray    Sig: Place 2 sprays into the nose daily.  . predniSONE (DELTASONE) 5 MG tablet    Sig: Take 10 mg by mouth as directed.  . predniSONE (DELTASONE) 10 MG tablet    Sig: Take 10 mg by mouth as directed.    Refill:  0  . verapamil (CALAN) 40 MG tablet    Sig: Take 40 mg by mouth daily.  . potassium chloride (KLOR-CON) 20 MEQ packet    Sig: Take 20 mEq by mouth daily.  . metFORMIN (GLUCOPHAGE) 500 MG tablet    Sig: Take 500 mg by mouth daily.  . Omeprazole 20 MG TBEC    Sig: Take 20 mg by mouth daily.  . mirtazapine (REMERON) 15 MG tablet    Sig: Take 15 mg by mouth at bedtime.  . Multiple Vitamin (MULTIVITAMIN) capsule    Sig: Take 1 capsule by mouth daily.  . vitamin B-12 (CYANOCOBALAMIN) 100 MCG tablet    Sig: Take 100 mcg by mouth daily.  . hydrOXYzine (ATARAX/VISTARIL) 50 MG tablet    Sig: Take 50 mg by mouth as needed.  . promethazine (PHENERGAN) 25 MG tablet    Sig: Take 25 mg by mouth as needed.  Marland Kitchen acetaminophen (RA ACETAMINOPHEN) 650 MG CR tablet    Sig: Take 650 mg by mouth as needed.  . diclofenac (VOLTAREN) 75 MG EC tablet    Sig: Take 75 mg by mouth 2 (two) times daily.    Refill:  2      Follow up plan: No Follow-up on file.

## 2016-05-20 DIAGNOSIS — G5792 Unspecified mononeuropathy of left lower limb: Secondary | ICD-10-CM | POA: Diagnosis not present

## 2016-05-20 DIAGNOSIS — G5791 Unspecified mononeuropathy of right lower limb: Secondary | ICD-10-CM | POA: Diagnosis not present

## 2016-05-21 DIAGNOSIS — G8929 Other chronic pain: Secondary | ICD-10-CM | POA: Diagnosis not present

## 2016-05-21 DIAGNOSIS — M545 Low back pain: Secondary | ICD-10-CM | POA: Diagnosis not present

## 2016-05-28 DIAGNOSIS — M545 Low back pain: Secondary | ICD-10-CM | POA: Diagnosis not present

## 2016-05-28 DIAGNOSIS — G541 Lumbosacral plexus disorders: Secondary | ICD-10-CM | POA: Diagnosis not present

## 2016-05-28 DIAGNOSIS — G894 Chronic pain syndrome: Secondary | ICD-10-CM | POA: Diagnosis not present

## 2016-05-28 DIAGNOSIS — Z79891 Long term (current) use of opiate analgesic: Secondary | ICD-10-CM | POA: Diagnosis not present

## 2016-05-28 DIAGNOSIS — G603 Idiopathic progressive neuropathy: Secondary | ICD-10-CM | POA: Diagnosis not present

## 2016-05-28 DIAGNOSIS — R51 Headache: Secondary | ICD-10-CM | POA: Diagnosis not present

## 2016-05-28 DIAGNOSIS — M25561 Pain in right knee: Secondary | ICD-10-CM | POA: Diagnosis not present

## 2016-05-28 DIAGNOSIS — M25562 Pain in left knee: Secondary | ICD-10-CM | POA: Diagnosis not present

## 2016-06-01 DIAGNOSIS — E2749 Other adrenocortical insufficiency: Secondary | ICD-10-CM | POA: Diagnosis not present

## 2016-06-01 DIAGNOSIS — R5382 Chronic fatigue, unspecified: Secondary | ICD-10-CM | POA: Diagnosis not present

## 2016-06-03 DIAGNOSIS — E2749 Other adrenocortical insufficiency: Secondary | ICD-10-CM | POA: Diagnosis not present

## 2016-06-08 ENCOUNTER — Emergency Department: Payer: PPO

## 2016-06-08 ENCOUNTER — Emergency Department
Admission: EM | Admit: 2016-06-08 | Discharge: 2016-06-08 | Disposition: A | Discharge status: Home / Self Care | Payer: PPO | Attending: Student | Admitting: Student

## 2016-06-08 DIAGNOSIS — F329 Major depressive disorder, single episode, unspecified: Secondary | ICD-10-CM | POA: Diagnosis not present

## 2016-06-08 DIAGNOSIS — M5416 Radiculopathy, lumbar region: Secondary | ICD-10-CM | POA: Diagnosis not present

## 2016-06-08 DIAGNOSIS — F17210 Nicotine dependence, cigarettes, uncomplicated: Secondary | ICD-10-CM | POA: Diagnosis not present

## 2016-06-08 DIAGNOSIS — M1990 Unspecified osteoarthritis, unspecified site: Secondary | ICD-10-CM | POA: Insufficient documentation

## 2016-06-08 DIAGNOSIS — M25521 Pain in right elbow: Secondary | ICD-10-CM | POA: Insufficient documentation

## 2016-06-08 DIAGNOSIS — M5441 Lumbago with sciatica, right side: Secondary | ICD-10-CM | POA: Diagnosis not present

## 2016-06-08 DIAGNOSIS — M5418 Radiculopathy, sacral and sacrococcygeal region: Secondary | ICD-10-CM | POA: Diagnosis not present

## 2016-06-08 DIAGNOSIS — Z7951 Long term (current) use of inhaled steroids: Secondary | ICD-10-CM | POA: Insufficient documentation

## 2016-06-08 DIAGNOSIS — Z79899 Other long term (current) drug therapy: Secondary | ICD-10-CM | POA: Insufficient documentation

## 2016-06-08 DIAGNOSIS — Z7901 Long term (current) use of anticoagulants: Secondary | ICD-10-CM | POA: Diagnosis not present

## 2016-06-08 DIAGNOSIS — E119 Type 2 diabetes mellitus without complications: Secondary | ICD-10-CM | POA: Insufficient documentation

## 2016-06-08 DIAGNOSIS — G8929 Other chronic pain: Secondary | ICD-10-CM | POA: Insufficient documentation

## 2016-06-08 DIAGNOSIS — Z7984 Long term (current) use of oral hypoglycemic drugs: Secondary | ICD-10-CM | POA: Insufficient documentation

## 2016-06-08 DIAGNOSIS — Z7952 Long term (current) use of systemic steroids: Secondary | ICD-10-CM | POA: Insufficient documentation

## 2016-06-08 DIAGNOSIS — Z791 Long term (current) use of non-steroidal anti-inflammatories (NSAID): Secondary | ICD-10-CM | POA: Diagnosis not present

## 2016-06-08 DIAGNOSIS — M7989 Other specified soft tissue disorders: Secondary | ICD-10-CM | POA: Diagnosis not present

## 2016-06-08 DIAGNOSIS — M545 Low back pain: Secondary | ICD-10-CM | POA: Diagnosis not present

## 2016-06-08 MED ORDER — METHOCARBAMOL 500 MG PO TABS: 500.0000 mg | ORAL_TABLET | Freq: Four times a day (QID) | ORAL | Status: DC

## 2016-06-08 NOTE — ED Notes (Signed)
Pt c/o lower back pain and leg pain that he has chronic issues with that has flared up in the past 3 days.Gregory Cervantes

## 2016-06-08 NOTE — Discharge Instructions (Signed)
Chronic Back Pain  When back pain lasts longer than 3 months, it is called chronic back pain.People with chronic back pain often go through certain periods that are more intense (flare-ups).  CAUSES Chronic back pain can be caused by wear and tear (degeneration) on different structures in your back. These structures include:  The bones of your spine (vertebrae) and the joints surrounding your spinal cord and nerve roots (facets).  The strong, fibrous tissues that connect your vertebrae (ligaments). Degeneration of these structures may result in pressure on your nerves. This can lead to constant pain. HOME CARE INSTRUCTIONS  Avoid bending, heavy lifting, prolonged sitting, and activities which make the problem worse.  Take brief periods of rest throughout the day to reduce your pain. Lying down or standing usually is better than sitting while you are resting.  Take over-the-counter or prescription medicines only as directed by your caregiver. SEEK IMMEDIATE MEDICAL CARE IF:   You have weakness or numbness in one of your legs or feet.  You have trouble controlling your bladder or bowels.  You have nausea, vomiting, abdominal pain, shortness of breath, or fainting.   This information is not intended to replace advice given to you by your health care provider. Make sure you discuss any questions you have with your health care provider.   Document Released: 01/07/2005 Document Revised: 02/22/2012 Document Reviewed: 05/20/2015 Elsevier Interactive Patient Education 2016 [place] [employer] Joint pain, which is also called arthralgia, can be caused by many things. Joint pain often goes away when you follow your health care provider's instructions for relieving pain at home. However, joint pain can also be caused by conditions that require further treatment. Common causes of joint pain include:  Bruising in the area of the joint.  Overuse of the joint.  Wear and tear on the joints  that occur with aging (osteoarthritis).  Various other forms of arthritis.  A buildup of a crystal form of uric acid in the joint (gout).  Infections of the joint (septic arthritis) or of the bone (osteomyelitis). Your health care provider may recommend medicine to help with the pain. If your joint pain continues, additional tests may be needed to diagnose your condition. HOME CARE INSTRUCTIONS Watch your condition for any changes. Follow these instructions as directed to lessen the pain that you are feeling.  Take medicines only as directed by your health care provider.  Rest the affected area for as long as your health care provider says that you should. If directed to do so, raise the painful joint above the level of your heart while you are sitting or lying down.  Do not do things that cause or worsen pain.  If directed, apply ice to the painful area:  Put ice in a plastic bag.  Place a towel between your skin and the bag.  Leave the ice on for 20 minutes, 2-3 times per day.  Wear an elastic bandage, splint, or sling as directed by your health care provider. Loosen the elastic bandage or splint if your fingers or toes become numb and tingle, or if they turn cold and blue.  Begin exercising or stretching the affected area as directed by your health care provider. Ask your health care provider what types of exercise are safe for you.  Keep all follow-up visits as directed by your health care provider. This is important. SEEK MEDICAL CARE IF:  Your pain increases, and medicine does not help.  Your joint pain does not improve  within 3 days.  You have increased bruising or swelling.  You have a fever.  You lose 10 lb (4.5 kg) or more without trying. SEEK IMMEDIATE MEDICAL CARE IF:  You are not able to move the joint.  Your fingers or toes become numb or they turn cold and blue.   This information is not intended to replace advice given to you by your health care  provider. Make sure you discuss any questions you have with your health care provider.   Document Released: 11/30/2005 Document Revised: 12/21/2014 Document Reviewed: 09/11/2014 Elsevier Interactive Patient Education [employer].

## 2016-06-08 NOTE — ED Notes (Signed)
Patient transported to X-ray 

## 2016-06-08 NOTE — ED Provider Notes (Signed)
[facility] Emergency Department Provider Note  ____________________________________________  Time seen: Approximately 7:07 PM  I have reviewed the triage vital signs and the nursing notes.   HISTORY  Chief Complaint Back Pain and Leg Pain    HPI Gregory Cervantes is a 60 y.o. male who presents emergency department complaining of lower back pain radiating into the right leg. Patient states that this is an ongoing chronic problem that is being followed by pain management specialist. Patient is upset that he has had his chronic narcotic pain medication reduced by his pain management specialist. Patient states that the reason this occurred was that he started to come up short on his pain medication count. Patient was reduced from 6 x15 milligrams oxycodone daily to 4 x 10 mg oxycodonedaily. Patient does not feel like this is adequate pain medication. He states that he has been told that he could come to the emergency department and have more pain medication prescribed. Patient states that this is a chronic condition that was exacerbated by the fact that he was working on his car week ago. Patient has disc protrusion into the lumbar region diagnosed by MRI. Patient states that the pain has increased but other symptoms to include numbness and tingling have not changed or increased from baseline. Patient denies any bowel or bladder dysfunction, saddle anesthesia, or paresthesias. He denies any significant trauma to the region.  After patient learned that he was not allowed to have further prescriptions for narcotics without an acute injury he also began to complain of right elbow pain. He states that he struck this against the door of the car week ago. Patient denies any other injuries. He reports that he has full range of motion to the elbow. He reports pain to the distal humerus, posterior elbow. He endorses having surgery to this elbow approximately 15 years ago in [place]. Denies  any numbness or tingling in his hands.   Past Medical History  Diagnosis Date  . Arthritis   . Headache   . BPH (benign prostatic hyperplasia)   . Insomnia   . Chronic back pain     Patient Active Problem List   Diagnosis Date Noted  . Arthritis 05/18/2016  . Benign fibroma of prostate 05/18/2016  . Dental disease 05/18/2016  . ANA positive 03/31/2016  . Arthralgia of multiple joints 03/31/2016  . Elevated erythrocyte sedimentation rate 03/31/2016  . CFIDS (chronic fatigue and immune dysfunction syndrome) 03/25/2016  . Contact with and exposure to tuberculosis 03/25/2016  . Chronic painful diabetic neuropathy ([place]) 03/05/2016  . Numbness and tingling 03/05/2016  . Arthritis, degenerative 01/22/2016  . 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[place]) 09/25/2015  . Breast lump 04/15/2015  . Clinical depression 10/22/2014  . Major depressive disorder with single episode ([place]) 10/22/2014  . Exomphalos 08/02/2014  . Avascular necrosis of bone of hip ([place]) 08/01/2014  . Idiopathic aseptic necrosis of bone ([place]) 08/01/2014  . Arthropathia 07/26/2014  . Benign prostatic hypertrophy without urinary obstruction 07/26/2014  . Disease of supporting structures of teeth 07/26/2014  . Chronic pain associated with significant psychosocial dysfunction 04/12/2014  . History of cervical spinal surgery 04/12/2014  . Failed back syndrome of lumbar spine 04/12/2014  . Degenerative arthritis of lumbar spine 04/12/2014  . L-S radiculopathy 04/12/2014  . Vascular disorder of lower extremity 04/09/2014  . History of anticoagulant therapy 04/09/2014  . Articular cartilage disease 03/28/2014  . Defect of articular cartilage 03/28/2014  . Accumulation of fluid in tissues 03/28/2014  . Edema, pitting  03/28/2014  . Current tear of lateral cartilage or meniscus of knee 03/28/2014  . Current tear knee, lateral meniscus 03/28/2014  . Gonalgia 02/14/2014  . Arthralgia of lower leg 02/14/2014  . Abnormal blood sugar  11/01/2013  . Disorder of eye movements 11/01/2013  . Candidiasis of esophagus ([place]) 11/01/2013  . Abnormal eye movements 11/01/2013  . Cephalalgia 11/01/2013  . Blood glucose elevated 11/01/2013  . Elevated WBC count 11/01/2013  . Long term current use of opiate analgesic 09/25/2013  . Other long term (current) drug therapy 09/25/2013  . Back ache 10/08/2012  . Back pain, thoracic 10/08/2012  . 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 Testicular hypofunction 10/04/2012  . Back pain, chronic 09/29/2012  . Cannot sleep 09/29/2012  . 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 Idiopathic localized osteoarthropathy 11/18/2011  . Cervical post-laminectomy syndrome 09/01/2011  . Narrowing of intervertebral disc space 11/28/2003  . LBP (low back pain) 11/28/2003    Past Surgical History  Procedure Laterality Date  . Dg shoulder right complete    . Brain surgery    . Hernia repair    . Back surgery    . Knee arthroscopy      Current Outpatient Rx  Name  Route  Sig  Dispense  Refill  . acetaminophen (RA ACETAMINOPHEN) 650 MG CR tablet   Oral   Take 650 mg by mouth as needed.         . ALPRAZolam (XANAX) 0.5 MG tablet   Oral   Take 1 mg by mouth at bedtime.         . ALPRAZolam (XANAX) 1 MG tablet   Oral   Take 1 mg by mouth at bedtime as needed.         . baclofen (LIORESAL) 10 MG tablet   Oral   Take 1 tablet (10 mg total) by mouth 2 (two) times daily as needed for muscle spasms (pain).   20 tablet   0   . Cholecalciferol (VITAMIN D3) 5000 units CAPS   Oral   Take 5,000 Units by mouth daily.         . citalopram (CELEXA) 40 MG tablet   Oral   Take 40 mg by mouth daily.         . diclofenac (VOLTAREN) 75 MG EC tablet   Oral   Take 75 mg by mouth 2 (two) times daily.      2   . fluticasone (FLONASE) 50 MCG/ACT nasal spray   Nasal   Place 2 sprays into the nose daily.         Marland Kitchen gabapentin (NEURONTIN) 300 MG capsule   Oral   Take 900 mg by mouth 3 (three) times  daily.         . hydrocortisone (CORTEF) 5 MG tablet   Oral   Take 4 tablets by mouth daily.         Marland Kitchen ibuprofen (ADVIL,MOTRIN) 600 MG tablet   Oral   Take 600 mg by mouth 4 (four) times daily as needed.         . metFORMIN (GLUCOPHAGE) 500 MG tablet   Oral   Take 500 mg by mouth daily.         . methocarbamol (ROBAXIN) 500 MG tablet   Oral   Take 1 tablet (500 mg total) by mouth 4 (four) times daily.   16 tablet   0   . mirtazapine (REMERON) 15 MG tablet   Oral   Take 15 mg by mouth  at bedtime.         . Multiple Vitamin (MULTIVITAMIN) capsule   Oral   Take 1 capsule by mouth daily.         . naloxone (NARCAN) 0.4 MG/ML injection   Intramuscular   Inject 0.4 mg into the muscle as needed.         . Omeprazole 20 MG TBEC   Oral   Take 20 mg by mouth daily.         . ondansetron (ZOFRAN) 4 MG tablet   Oral   Take 1 tablet (4 mg total) by mouth every 8 (eight) hours as needed for nausea or vomiting.   20 tablet   0   . oxycodone (OXY-IR) 5 MG capsule   Oral   Take 5 mg by mouth every 4 (four) hours as needed.         Marland Kitchen oxyCODONE (ROXICODONE) 15 MG immediate release tablet   Oral   Take 15 mg by mouth 4 (four) times daily as needed.         Marland Kitchen oxyCODONE-acetaminophen (PERCOCET/ROXICET) 5-325 MG tablet   Oral   Take 1 tablet by mouth every 6 (six) hours.         . potassium chloride (KLOR-CON) 20 MEQ packet   Oral   Take 20 mEq by mouth daily.         . predniSONE (DELTASONE) 10 MG tablet   Oral   Take 10 mg by mouth as directed.      0   . predniSONE (DELTASONE) 5 MG tablet   Oral   Take 10 mg by mouth as directed.         Marland Kitchen EXPIRED: promethazine (PHENERGAN) 25 MG tablet   Oral   Take 25 mg by mouth as needed.         Marland Kitchen QUEtiapine (SEROQUEL) 100 MG tablet   Oral   Take 100 mg by mouth at bedtime.         Marland Kitchen QUEtiapine (SEROQUEL) 25 MG tablet   Oral   Take 25 mg by mouth at bedtime.         . SUMAtriptan  (IMITREX) 6 MG/0.5ML SOLN injection   Intramuscular   Inject 0.6 mg into the muscle as needed.         Marland Kitchen tiZANidine (ZANAFLEX) 4 MG tablet   Oral   Take 4 mg by mouth at bedtime.         . verapamil (CALAN) 40 MG tablet   Oral   Take 40 mg by mouth daily.         . vitamin B-12 (CYANOCOBALAMIN) 100 MCG tablet   Oral   Take 100 mcg by mouth daily.         Marland Kitchen warfarin (COUMADIN) 1 MG tablet   Oral   Take 1 mg by mouth daily.         Marland Kitchen warfarin (COUMADIN) 5 MG tablet   Oral   Take 5 mg by mouth daily.           Allergies Review of patient's allergies indicates no known allergies.  No family history on file.  Social History Social History  Substance Use Topics  . Smoking status: Current Every Day Smoker -- 1.00 packs/day    Types: Cigarettes  . Smokeless tobacco: None  . Alcohol Use: 0.0 oz/week    0 Standard drinks or equivalent per week     Comment: three times a year  Review of Systems  Constitutional: No fever/chills Eyes: No visual changes.  Cardiovascular: no chest pain. Respiratory: no cough. No SOB. Gastrointestinal: No abdominal pain.  No nausea, no vomiting.  Genitourinary: Negative for dysuria. No hematuria Musculoskeletal: Positive for chronic lower back pain with radicular symptoms down right leg. Patient also reports right elbow pain. Skin: Negative for rash, abrasions, lacerations, ecchymosis. Neurological: Negative for headaches, focal weakness or numbness. 10-point ROS otherwise negative.  ____________________________________________   PHYSICAL EXAM:  VITAL SIGNS: ED Triage Vitals  Enc Vitals Group     BP 06/08/16 1826 151/94 mmHg     Pulse Rate 06/08/16 1826 121     Resp 06/08/16 1826 17     Temp 06/08/16 1826 97.9 F (36.6 C)     Temp Source 06/08/16 1826 Oral     SpO2 06/08/16 1826 95 %     Weight 06/08/16 1826 195 lb (88.451 kg)     Height 06/08/16 1826 5\' 11"  (1.803 m)     Head Cir --      Peak Flow --      Pain  Score 06/08/16 1827 9     Pain Loc --      Pain Edu? --      Excl. in [place]? --      Constitutional: Alert and oriented. Well appearing and in no acute distress. Eyes: Conjunctivae are normal. PERRL. EOMI. Head: Atraumatic. Cardiovascular: Normal rate, regular rhythm. Normal S1 and S2.  Good peripheral circulation. Respiratory: Normal respiratory effort without tachypnea or retractions. Lungs CTAB. Good air entry to the bases with no decreased or absent breath sounds. Gastrointestinal: Bowel sounds 4 quadrants. Soft and nontender to palpation. No guarding or rigidity. No palpable masses. No distention. No CVA tenderness. Musculoskeletal: Full range of motion to all extremities. No gross deformities appreciated. No deformities noted to spine upon inspection. Patient is nontender to palpation midline spinal processes. Patient is diffusely tender to palpation in the paraspinal muscle groups bilaterally. Patient is tender to palpation over sciatic notch. Dorsalis pedis pulses appreciated bilaterally lower extremities. Sensation intact and equal lower extremities. Patient reports pain to his right elbow. No edema is noted. Patient reports tenderness diffusely over the posterior elbow/distal humerus. No tenderness to palpation over the lateral, medial, or anterior aspect of the elbow. No ecchymosis or hematoma is noted. No palpable abnormality. Full range of motion of elbow. Radial pulse intact. Sensation intact 5 digits right upper extremity. Neurologic:  Normal speech and language. No gross focal neurologic deficits are appreciated.  Skin:  Skin is warm, dry and intact. No rash noted. Psychiatric: Mood and affect are normal. Speech and behavior are normal. Patient exhibits appropriate insight and judgement.   ____________________________________________   LABS (all labs ordered are listed, but only abnormal results are displayed)  Labs Reviewed - No data to  display ____________________________________________  EKG   ____________________________________________  RADIOLOGY   Dg Elbow Complete Right  06/08/2016  CLINICAL DATA:  Fall 2 weeks ago with posterior right elbow injury and persistent pain and swelling. EXAM: RIGHT ELBOW - COMPLETE 3+ VIEW COMPARISON:  None. FINDINGS: Evidence of degenerative changes of the elbow joint. Suggestion of the lucency over the articular surface of the radial head likely a fracture. Remainder the exam is within normal. IMPRESSION: Suggestion of a nondisplaced fracture involving the articular surface of the radial head. Significant degenerative changes of the elbow. Electronically Signed   By: Marin Olp M.D.   On: 06/08/2016 19:59    ____________________________________________  PROCEDURES  Procedure(s) performed:       Medications - No data to display   ____________________________________________   INITIAL IMPRESSION / ASSESSMENT AND PLAN / ED COURSE  Pertinent labs & imaging results that were available during my care of the patient were reviewed by me and considered in my medical decision making (see chart for details).  Patient's diagnosis is consistent with chronic lower back pain. This is exacerbated by increased activity the last week. Patient is under pain management but tells provider "I've been told to come to the emergency department and you'll prescribe more pain medications for me if I should have more pain." Patient has been coming up short on his pill counts with his pain management specialist. Patient states that because of this he has been reduced on his amount of pain medication. When advised that I would provide injection of Toradol and muscle relaxer for his increased complaints, Patient became verbally agitated and upset and angry. Patient demanded to have further narcotics for him and states that he expects [place] to write prescriptions for pain meds as [facility] has written him  prescriptions in the past. I advised the patient under no circumstances would he be provided further narcotics. Patient became very angry and demanded to speak with a representative to file a complaint that we will not fill pain prescriptions. Patient relations is contacted and they discussed this with the patient. Patient is still upset that he is not to receive any pain medications. Patient has refused both patient relations and my offer for Toradol injection with muscle relaxers to go home with. Her hearing from myself and patient relations that he would not receive prescription narcotics for continued chronic pain,  patient decided that he would like a prescription for muscle relaxer. After learning that he would not receive pain medications in the emergency department without significant current traumatic injury the Patient also endorses at this time pain to his right elbow status post an injury a week ago and is asking for evaluation of this complaint. X-rays ordered at this time. X-rays returned with a possible lucency over the articular surface of the radial head. Patient is endorsing no lateral elbow pain and injury does not meet suggested findings on x-ray. Patient has significant degenerative changes throughout the elbow on x-ray. Patient is tender to palpation over the distal humerus/posterior elbow but denies any pain with palpation to the lateral or medial aspect of the elbow. Patient has full range of motion to the elbow which was observed prior to patient endorsing a complaint of elbow pain. As such, it is questionable whether patient truly has a fracture in this region. The patient will receive a sling and is advised to follow-up with orthopedics for further evaluation. However, patient will not be splinted or definitively diagnosed with a fracture at this time.  I have discussed the patient's complaints, ED course, x-ray, and physical exam with my attending, Dr. Edd Fabian. At this time patient has an  exacerbation of chronic pain in the lumbar region. Patient only endorsed elbow pain after being told that he would've pain medication without any acute traumatic injury. X-ray revealed questionable lucency in the radial head. Patient's pain and mechanism of injury do not match this injury. However, with suggestive x-ray findings his arm will be placed in a sling and he will follow up with orthopedics for further evaluation.  Patient has exhibited drug-seeking behavior with requesting pain medication and initially denying that he was under pain management. After being presented  with the fact that he has been to this facility in the past with drug withdrawal symptoms and admitted at that time that he had used more medication than he was prescribed and was out prior to refill time patient, he admitted that he was under pain management. Patient was requesting pain management with narcotics at this time stating "my doctor has told me to come to the ER and that you will write narcotic prescriptions for me if I should have more pain." After being informed that since he was on pain management and we would be unable to prescribe any further narcotics patient became very angry. He spoke with patient relations and when he was informed that the only reason that we would give narcotics in the ER to someone with chronic pain was due to an acute injury, patient began to endorse an traumatic injury to the right elbow. When informed that I would still not prescribe narcotics for at home use, the patient stated that his pain in his elbow was a different type of pain than his chronic pain and that he should receive narcotics anyways. Patient was informed that this was not the case as his prescribed narcotics would cover all pain complaints. Patient was still verbally upset and angry that he would not receive narcotics at this time. He stated that he would contact the state to enquire about the laws regarding pain management. I  informed patient that this was a institutional policy to not refill or prescribed narcotics while on continual therapy. I informed the patient that since his pain management provider is willing to prescribe narcotics that he should present the story to his regular prescriber and that they would be more than happy to see him and prescribe pain medications if they deem necessary.  Patient verbally threatened the provider as well as used frank profanity in expressing his opinion on pain management policies and the fact that I would not prescribe narcotics. Due to this fact, Curator was present for patient's discharge.  At this point, patient will be discharged home with no controlled substance medications. He is to follow-up with pain management for continued management of a chronic pain condition. Patient will follow-up with orthopedics for further evaluation of elbow pain.  Patient is given ED precautions to return to the ED for any worsening or new symptoms.     ____________________________________________  FINAL CLINICAL IMPRESSION(S) / ED DIAGNOSES  Final diagnoses:  Chronic midline low back pain with right-sided sciatica  Elbow pain, right      NEW MEDICATIONS STARTED DURING THIS VISIT:  Discharge Medication List as of 06/08/2016  8:42 PM    START taking these medications   Details  methocarbamol (ROBAXIN) 500 MG tablet Take 1 tablet (500 mg total) by mouth 4 (four) times daily., Starting 06/08/2016, Until Discontinued, Print            This chart was dictated using voice recognition software/Dragon. Despite best efforts to proofread, errors can occur which can change the meaning. Any change was purely unintentional.    Darletta Moll, PA-C 06/08/16 2110  Joanne Gavel, MD 06/08/16 2337

## 2016-06-08 NOTE — ED Notes (Signed)
Pateint presents to ED with c/o RIGHT elbow pain, reports "my knee buckled and I hit my elbow on the door handle." No obvious deformity noted. (+) sensation, pulses, and movement,

## 2016-06-15 ENCOUNTER — Ambulatory Visit: Payer: Self-pay | Admitting: Family Medicine

## 2016-06-19 DIAGNOSIS — R5382 Chronic fatigue, unspecified: Secondary | ICD-10-CM | POA: Diagnosis not present

## 2016-06-19 DIAGNOSIS — R202 Paresthesia of skin: Secondary | ICD-10-CM | POA: Diagnosis not present

## 2016-06-19 DIAGNOSIS — M7021 Olecranon bursitis, right elbow: Secondary | ICD-10-CM | POA: Diagnosis not present

## 2016-06-19 DIAGNOSIS — R700 Elevated erythrocyte sedimentation rate: Secondary | ICD-10-CM | POA: Diagnosis not present

## 2016-06-19 DIAGNOSIS — R200 Anesthesia of skin: Secondary | ICD-10-CM | POA: Diagnosis not present

## 2016-06-19 DIAGNOSIS — R768 Other specified abnormal immunological findings in serum: Secondary | ICD-10-CM | POA: Diagnosis not present

## 2016-06-19 DIAGNOSIS — M1992 Post-traumatic osteoarthritis, unspecified site: Secondary | ICD-10-CM | POA: Diagnosis not present

## 2016-06-19 DIAGNOSIS — M2550 Pain in unspecified joint: Secondary | ICD-10-CM | POA: Diagnosis not present

## 2016-06-19 DIAGNOSIS — M87051 Idiopathic aseptic necrosis of right femur: Secondary | ICD-10-CM | POA: Diagnosis not present

## 2016-06-19 DIAGNOSIS — M153 Secondary multiple arthritis: Secondary | ICD-10-CM | POA: Diagnosis not present

## 2016-06-19 DIAGNOSIS — M87052 Idiopathic aseptic necrosis of left femur: Secondary | ICD-10-CM | POA: Diagnosis not present

## 2016-06-25 DIAGNOSIS — G603 Idiopathic progressive neuropathy: Secondary | ICD-10-CM | POA: Diagnosis not present

## 2016-06-25 DIAGNOSIS — M545 Low back pain: Secondary | ICD-10-CM | POA: Diagnosis not present

## 2016-06-25 DIAGNOSIS — M79662 Pain in left lower leg: Secondary | ICD-10-CM | POA: Diagnosis not present

## 2016-06-25 DIAGNOSIS — M79661 Pain in right lower leg: Secondary | ICD-10-CM | POA: Diagnosis not present

## 2016-06-25 DIAGNOSIS — M542 Cervicalgia: Secondary | ICD-10-CM | POA: Diagnosis not present

## 2016-06-25 DIAGNOSIS — G894 Chronic pain syndrome: Secondary | ICD-10-CM | POA: Diagnosis not present

## 2016-06-25 DIAGNOSIS — G541 Lumbosacral plexus disorders: Secondary | ICD-10-CM | POA: Diagnosis not present

## 2016-07-17 ENCOUNTER — Ambulatory Visit (INDEPENDENT_AMBULATORY_CARE_PROVIDER_SITE_OTHER): Payer: PPO | Admitting: Unknown Physician Specialty

## 2016-07-17 ENCOUNTER — Encounter: Payer: Self-pay | Admitting: Unknown Physician Specialty

## 2016-07-17 VITALS — BP 134/89 | HR 106 | Temp 97.6°F | Ht 70.9 in | Wt 199.0 lb

## 2016-07-17 DIAGNOSIS — M549 Dorsalgia, unspecified: Secondary | ICD-10-CM | POA: Diagnosis not present

## 2016-07-17 DIAGNOSIS — R5382 Chronic fatigue, unspecified: Secondary | ICD-10-CM | POA: Diagnosis not present

## 2016-07-17 DIAGNOSIS — M9979 Connective tissue and disc stenosis of intervertebral foramina of abdomen and other regions: Secondary | ICD-10-CM

## 2016-07-17 DIAGNOSIS — R700 Elevated erythrocyte sedimentation rate: Secondary | ICD-10-CM | POA: Diagnosis not present

## 2016-07-17 DIAGNOSIS — R7611 Nonspecific reaction to tuberculin skin test without active tuberculosis: Secondary | ICD-10-CM | POA: Diagnosis not present

## 2016-07-17 DIAGNOSIS — G8929 Other chronic pain: Secondary | ICD-10-CM

## 2016-07-17 DIAGNOSIS — R768 Other specified abnormal immunological findings in serum: Secondary | ICD-10-CM | POA: Diagnosis not present

## 2016-07-17 NOTE — Patient Instructions (Addendum)
Get the book: From Fatigued to [place].  By Tietlebaum  Take a Vitamin D3 supplement.  4-5K/day

## 2016-07-17 NOTE — Progress Notes (Signed)
BP 134/89 (BP Location: Left Arm, Patient Position: Sitting, Cuff Size: Large)   Pulse (!) 106   Temp 97.6 F (36.4 C)   Ht 5' 10.9" (1.801 m)   Wt 199 lb (90.3 kg)   SpO2 96%   BMI 27.83 kg/m    Subjective:    Patient ID: Gregory Cervantes, male    DOB: 1956-03-02, 60 y.o.   MRN: [id number]  HPI: Gregory Cervantes is a 60 y.o. male  Chief Complaint  Patient presents with  . Establish Care   "if you can figure out why I feel like dying all the time you will be my hero"  He is complaining of various symptoms with:   Chronic pain Pt states he has chronic pain and fatigue.  He is under the care of a pain clinic.  He does have significan spinal stenosis with one back surgery  complaints of dramatic periods of nausea and vomiting.  He is seeing a gastroenterologist  Pressure in head and eyes- he is seeing a neurologist but further testing should be done.  Tried flonase.  Feels things are "plugged" deep in his head.  Any activity makes it worse.    Right elbow- would like to get it repaired  Left knee - collapses despite 3 surgeries.     Positive ANA/sed rate/CRP - Saw Rheumatology.  Reviewed those notes   Positive Tb- Started treatment but stopped after first dose.    Adrenal insufficiency - Noted low cortisol level due to over prescribing of glucocorticoids.  Has seen an Endocrinologist    Relevant past medical, surgical, family and social history reviewed and updated as indicated. Interim medical history since our last visit reviewed. Allergies and medications reviewed and updated.  Review of Systems  Constitutional: Positive for appetite change and fatigue.  HENT: Positive for ear pain.   Eyes: Positive for visual disturbance.  Respiratory: Positive for shortness of breath.   Musculoskeletal: Positive for arthralgias, back pain and myalgias.  Neurological: Positive for light-headedness and headaches.    Per HPI unless specifically indicated above     Objective:     BP 134/89 (BP Location: Left Arm, Patient Position: Sitting, Cuff Size: Large)   Pulse (!) 106   Temp 97.6 F (36.4 C)   Ht 5' 10.9" (1.801 m)   Wt 199 lb (90.3 kg)   SpO2 96%   BMI 27.83 kg/m   Wt Readings from Last 3 Encounters:  07/17/16 199 lb (90.3 kg)  06/08/16 195 lb (88.5 kg)  05/18/16 190 lb (86.2 kg)    Physical Exam  Constitutional: He is oriented to person, place, and time. He appears well-developed and well-nourished. No distress.  HENT:  Head: Normocephalic and atraumatic.  Eyes: Conjunctivae and lids are normal. Right eye exhibits no discharge. Left eye exhibits no discharge. No scleral icterus.  Neck: Normal range of motion. Neck supple. No JVD present. Carotid bruit is not present.  Cardiovascular: Normal rate, regular rhythm and normal heart sounds.   Pulmonary/Chest: Effort normal and breath sounds normal. No respiratory distress.  Abdominal: Normal appearance. There is no splenomegaly or hepatomegaly.  Musculoskeletal: Normal range of motion.  Neurological: He is alert and oriented to person, place, and time.  Skin: Skin is warm, dry and intact. No rash noted. No pallor.  Psychiatric: He has a normal mood and affect. His behavior is normal. Judgment and thought content normal.    Results for orders placed or performed in visit on 11/20/13  Sedimentation  rate  Result Value Ref Range   Erythrocyte Sed Rate 10 0 - 20 mm/hr      Assessment & Plan:   Problem List Items Addressed This Visit      Unprioritized   ANA positive   Back pain, chronic   Chronic fatigue   Elevated sed rate   Narrowing of intervertebral disc space    Other Visit Diagnoses    Positive TB test    -  Primary   Relevant Orders   Ambulatory referral to Infectious Disease      Pt with multiple problems.  Real issues with Adrenal insufficiency and elevatd inflammtory markers and spinal stenosithat can explain many of his symptoms. He needs to f/u with endocrine and rheumatology.   He needs to see ID to manage latent tb.  Order entered.  I discussed that I cannot give him pain medication but understand he is in pain.    Follow up plan: Return in about 3 months (around 10/17/2016) for 30 minute visit please.

## 2016-07-21 DIAGNOSIS — E2749 Other adrenocortical insufficiency: Secondary | ICD-10-CM | POA: Diagnosis not present

## 2016-07-21 DIAGNOSIS — M150 Primary generalized (osteo)arthritis: Secondary | ICD-10-CM | POA: Diagnosis not present

## 2016-07-21 DIAGNOSIS — G4700 Insomnia, unspecified: Secondary | ICD-10-CM | POA: Diagnosis not present

## 2016-07-21 DIAGNOSIS — F1120 Opioid dependence, uncomplicated: Secondary | ICD-10-CM | POA: Diagnosis not present

## 2016-07-21 DIAGNOSIS — G8929 Other chronic pain: Secondary | ICD-10-CM | POA: Diagnosis not present

## 2016-07-21 DIAGNOSIS — R5383 Other fatigue: Secondary | ICD-10-CM | POA: Diagnosis not present

## 2016-07-21 DIAGNOSIS — G44019 Episodic cluster headache, not intractable: Secondary | ICD-10-CM | POA: Diagnosis not present

## 2016-07-22 DIAGNOSIS — M25562 Pain in left knee: Secondary | ICD-10-CM | POA: Diagnosis not present

## 2016-07-22 DIAGNOSIS — E2749 Other adrenocortical insufficiency: Secondary | ICD-10-CM | POA: Diagnosis not present

## 2016-07-22 DIAGNOSIS — M545 Low back pain: Secondary | ICD-10-CM | POA: Diagnosis not present

## 2016-07-22 DIAGNOSIS — G894 Chronic pain syndrome: Secondary | ICD-10-CM | POA: Diagnosis not present

## 2016-07-22 DIAGNOSIS — Z79891 Long term (current) use of opiate analgesic: Secondary | ICD-10-CM | POA: Diagnosis not present

## 2016-07-22 DIAGNOSIS — R51 Headache: Secondary | ICD-10-CM | POA: Diagnosis not present

## 2016-07-22 DIAGNOSIS — M25561 Pain in right knee: Secondary | ICD-10-CM | POA: Diagnosis not present

## 2016-07-27 ENCOUNTER — Emergency Department: Payer: No Typology Code available for payment source

## 2016-07-27 ENCOUNTER — Emergency Department
Admission: EM | Admit: 2016-07-27 | Discharge: 2016-07-27 | Disposition: A | Discharge status: Home / Self Care | Payer: No Typology Code available for payment source | Attending: Emergency Medicine | Admitting: Emergency Medicine

## 2016-07-27 ENCOUNTER — Encounter: Payer: Self-pay | Admitting: *Deleted

## 2016-07-27 DIAGNOSIS — S0093XA Contusion of unspecified part of head, initial encounter: Secondary | ICD-10-CM | POA: Insufficient documentation

## 2016-07-27 DIAGNOSIS — Y999 Unspecified external cause status: Secondary | ICD-10-CM | POA: Diagnosis not present

## 2016-07-27 DIAGNOSIS — Y9389 Activity, other specified: Secondary | ICD-10-CM | POA: Diagnosis not present

## 2016-07-27 DIAGNOSIS — R51 Headache: Secondary | ICD-10-CM | POA: Diagnosis not present

## 2016-07-27 DIAGNOSIS — F17210 Nicotine dependence, cigarettes, uncomplicated: Secondary | ICD-10-CM | POA: Insufficient documentation

## 2016-07-27 DIAGNOSIS — S0990XA Unspecified injury of head, initial encounter: Secondary | ICD-10-CM | POA: Diagnosis not present

## 2016-07-27 DIAGNOSIS — S199XXA Unspecified injury of neck, initial encounter: Secondary | ICD-10-CM | POA: Diagnosis not present

## 2016-07-27 DIAGNOSIS — S161XXA Strain of muscle, fascia and tendon at neck level, initial encounter: Secondary | ICD-10-CM | POA: Insufficient documentation

## 2016-07-27 DIAGNOSIS — Y92410 Unspecified street and highway as the place of occurrence of the external cause: Secondary | ICD-10-CM | POA: Diagnosis not present
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
## 2016-07-29 DIAGNOSIS — E2749 Other adrenocortical insufficiency: Secondary | ICD-10-CM | POA: Diagnosis not present

## 2016-07-29 DIAGNOSIS — R6882 Decreased libido: Secondary | ICD-10-CM | POA: Diagnosis not present

## 2016-08-04 ENCOUNTER — Encounter: Payer: Self-pay | Admitting: Unknown Physician Specialty

## 2016-08-04 ENCOUNTER — Ambulatory Visit (INDEPENDENT_AMBULATORY_CARE_PROVIDER_SITE_OTHER): Payer: PPO | Admitting: Unknown Physician Specialty

## 2016-08-04 VITALS — BP 124/72 | HR 85 | Temp 97.5°F | Ht 71.1 in | Wt 197.2 lb

## 2016-08-04 DIAGNOSIS — M5030 Other cervical disc degeneration, unspecified cervical region: Secondary | ICD-10-CM

## 2016-08-04 DIAGNOSIS — M4802 Spinal stenosis, cervical region: Secondary | ICD-10-CM

## 2016-08-04 DIAGNOSIS — M542 Cervicalgia: Secondary | ICD-10-CM

## 2016-08-04 DIAGNOSIS — M961 Postlaminectomy syndrome, not elsewhere classified: Secondary | ICD-10-CM | POA: Diagnosis not present
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
## 2016-08-12 DIAGNOSIS — M4722 Other spondylosis with radiculopathy, cervical region: Secondary | ICD-10-CM | POA: Diagnosis not present

## 2016-08-12 DIAGNOSIS — Z6827 Body mass index (BMI) 27.0-27.9, adult: Secondary | ICD-10-CM | POA: Diagnosis not present

## 2016-08-14 DIAGNOSIS — G44019 Episodic cluster headache, not intractable: Secondary | ICD-10-CM | POA: Diagnosis not present

## 2016-08-19 ENCOUNTER — Ambulatory Visit
Admission: RE | Admit: 2016-08-19 | Discharge: 2016-08-19 | Disposition: A | Discharge status: Home / Self Care | Payer: PPO | Source: Ambulatory Visit | Attending: Infectious Disease | Admitting: Infectious Disease

## 2016-08-19 ENCOUNTER — Encounter: Payer: Self-pay | Admitting: Infectious Disease

## 2016-08-19 ENCOUNTER — Ambulatory Visit (INDEPENDENT_AMBULATORY_CARE_PROVIDER_SITE_OTHER): Payer: PPO | Admitting: Infectious Disease

## 2016-08-19 VITALS — BP 124/83 | HR 75 | Temp 98.4°F | Ht 71.0 in | Wt 195.0 lb

## 2016-08-19 DIAGNOSIS — R61 Generalized hyperhidrosis: Secondary | ICD-10-CM | POA: Insufficient documentation

## 2016-08-19 DIAGNOSIS — Z227 Latent tuberculosis: Secondary | ICD-10-CM

## 2016-08-19 DIAGNOSIS — E2749 Other adrenocortical insufficiency: Secondary | ICD-10-CM | POA: Diagnosis not present

## 2016-08-19 DIAGNOSIS — R7611 Nonspecific reaction to tuberculin skin test without active tuberculosis: Secondary | ICD-10-CM | POA: Diagnosis not present

## 2016-08-19 DIAGNOSIS — F17200 Nicotine dependence, unspecified, uncomplicated: Secondary | ICD-10-CM

## 2016-08-19 DIAGNOSIS — R635 Abnormal weight gain: Secondary | ICD-10-CM

## 2016-08-19 DIAGNOSIS — Z720 Tobacco use: Secondary | ICD-10-CM

## 2016-08-19 HISTORY — DX: Latent tuberculosis: Z22.7

## 2016-08-19 HISTORY — DX: Abnormal weight gain: R63.5

## 2016-08-19 HISTORY — DX: Nicotine dependence, unspecified, uncomplicated: F17.200

## 2016-08-19 HISTORY — DX: Generalized hyperhidrosis: R61

## 2016-08-19 LAB — COMPLETE METABOLIC PANEL WITH GFR
ALT: 17 U/L (ref 9–46)
AST: 22 U/L (ref 10–35)
Albumin: 3.9 g/dL (ref 3.6–5.1)
Alkaline Phosphatase: 44 U/L (ref 40–115)
BUN: 14 mg/dL (ref 7–25)
CHLORIDE: 107 mmol/L (ref 98–110)
CO2: 24 mmol/L (ref 20–31)
CREATININE: 0.82 mg/dL (ref 0.70–1.33)
Calcium: 9.1 mg/dL (ref 8.6–10.3)
GFR, Est African American: >89 mL/min (ref >=60)
GFR, Est Non African American: >89 mL/min (ref >=60)
Glucose, Bld: 86 mg/dL (ref 65–99)
Potassium: 4.6 mmol/L (ref 3.5–5.3)
SODIUM: 138 mmol/L (ref 135–146)
Total Bilirubin: 0.2 mg/dL (ref 0.2–1.2)
Total Protein: 6.4 g/dL (ref 6.1–8.1)

## 2016-08-19 NOTE — Progress Notes (Signed)
Reason for Consult: LTB treatment  Requesting Physician: Dr. Julian Hy  Subjective:    Patient ID: Gregory Cervantes, male    DOB: [birth year], 60 y.o.   MRN: [id number]  HPI  60 year old man with mx medical problems including rheumatological condition that has not yet been clarified, adrenal insufficiency who has felt poorly for the past 3 years. Predominant symptoms have been night time and daytime sweats, but no fevers. He had QF done which was positive. He was referred to [facility] and seen by Dr. Ralene Ok who had seen patient previously for possible oral candidiasis.   Dr. Ralene Ok saw the patient this April and repeated QF gold which was + though he could not trace a specific risk factor other than mx hospitalizations. Patient has had "twenty surgeries" over the years.   Only other predominant symptom is early satiety.   Dr. Ralene Ok referred the pt to [facility] for rx of LTB and it appears they tried to give INH and rifapentine but he did not tolerate even the first dose. He is still seeing Rheumatology at [facility] but from reading their notes there is not imminent plan for TNF alpha antagonist. He is instead only on steroids. He denies fevers and is gaining rather than losing weight.  Past Medical History:  Diagnosis Date  . Arthritis   . BPH (benign prostatic hyperplasia)   . Chronic back pain   . Headache   . Insomnia   . Peripheral neuropathy ([place])   . TB lung, latent 08/19/2016    Past Surgical History:  Procedure Laterality Date  . BACK SURGERY    . BRAIN SURGERY     x5- spinal fluid leaks  . CERVICAL FUSION    . DG SHOULDER RIGHT COMPLETE     x6  . HAND SURGERY Left   . HERNIA REPAIR    . KNEE ARTHROSCOPY Left    x3  . PROSTATE SURGERY    . WISDOM TOOTH EXTRACTION      Family History  Problem Relation Age of Onset  . Cancer Mother     pancreatic and lung  . Cancer Father     leukemia  . Anxiety disorder Sister   . Anxiety disorder Sister   . Anxiety  disorder Sister       Social History   Social History  . Marital status: Divorced    Spouse name: N/A  . Number of children: N/A  . Years of education: N/A   Social History Main Topics  . Smoking status: Current Every Day Smoker    Packs/day: 0.50    Types: Cigarettes  . Smokeless tobacco: Never Used  . Alcohol use No     Comment: three times a year  . Drug use: No  . Sexual activity: No   Other Topics Concern  . None   Social History Narrative  . None    No Known Allergies   Current Outpatient Prescriptions:  .  alprazolam (XANAX) 2 MG tablet, Take 2 mg by mouth at bedtime as needed for sleep., Disp: , Rfl:  .  gabapentin (NEURONTIN) 400 MG capsule, Take 400 mg by mouth 3 (three) times daily., Disp: , Rfl:  .  Multiple Vitamin (MULTIVITAMIN) capsule, Take 1 capsule by mouth daily., Disp: , Rfl:  .  naloxone (NARCAN) 0.4 MG/ML injection, Inject 0.4 mg into the muscle as needed., Disp: , Rfl:  .  ondansetron (ZOFRAN) 4 MG tablet, Take 1 tablet (4 mg total) by mouth  every 8 (eight) hours as needed for nausea or vomiting., Disp: 20 tablet, Rfl: 0 .  oxyCODONE (ROXICODONE) 15 MG immediate release tablet, Take 15 mg by mouth 4 (four) times daily as needed., Disp: , Rfl:  .  promethazine (PHENERGAN) 25 MG tablet, Take 25 mg by mouth every 6 (six) hours as needed for nausea or vomiting., Disp: , Rfl:  .  QUEtiapine (SEROQUEL) 50 MG tablet, Take 50 mg by mouth at bedtime., Disp: , Rfl:  .  SUMAtriptan (IMITREX) 6 MG/0.5ML SOLN injection, Inject 0.6 mg into the muscle as needed., Disp: , Rfl:  .  zonisamide (ZONEGRAN) 100 MG capsule, Take 1 capsule by mouth 2 (two) times daily., Disp: , Rfl:    Review of Systems  Constitutional: Positive for unexpected weight change. Negative for chills and fever.  HENT: Negative for congestion and sore throat.   Eyes: Negative for photophobia.  Respiratory: Positive for cough. Negative for shortness of breath and wheezing.   Cardiovascular:  Negative for chest pain, palpitations and leg swelling.  Gastrointestinal: Negative for abdominal pain, blood in stool, constipation, diarrhea, nausea and vomiting.  Genitourinary: Negative for dysuria, flank pain and hematuria.  Musculoskeletal: Negative for back pain and myalgias.  Skin: Negative for rash.  Neurological: Negative for dizziness, weakness and headaches.  Hematological: Does not bruise/bleed easily.  Psychiatric/Behavioral: Negative for suicidal ideas.       Objective:   Physical Exam  Constitutional: He appears well-developed and well-nourished.  HENT:  Head: Atraumatic.  Right Ear: External ear normal.  Mouth/Throat: No oropharyngeal exudate.  Eyes: Conjunctivae and EOM are normal.  Neck: Normal range of motion. Neck supple. No JVD present.  Cardiovascular: Normal rate, regular rhythm and normal heart sounds.  Exam reveals no gallop and no friction rub.   No murmur heard. Pulmonary/Chest: Effort normal and breath sounds normal. No respiratory distress. He has no wheezes. He has no rales.  Abdominal: Soft. Bowel sounds are normal. He exhibits no distension. There is no tenderness. There is no rebound and no guarding.  Musculoskeletal: Normal range of motion. He exhibits no edema, tenderness or deformity.  Lymphadenopathy:    He has no cervical adenopathy.  Neurological: He is alert.  Psychiatric: His speech is normal and behavior is normal. Thought content normal. Cognition and memory are normal. He exhibits a depressed mood.          Assessment & Plan:   #1 Latent TB: no obvious evidence for pulmonary TB or TB elsewhere. I would have expected him to have done much worse with untreated active TB and immunosuppression with steroids. He has had CT chest abdomen and pelvis in 2014 which was around the time he first began feeling poorly  I WILL check CXR today for thoroughness  We will check HIV test  If CXR negative will give him 9 months of INH + b6  #2  Night time and daytime sweating: could be related to his CTD and or AI  #3 Smoker: encouraged him to quit.  I spent greater than 60 minutes with the patient including greater than 50% of time in face to face counsel of the patient re his LTB, night time and daytime sweating, CTD, AI and in coordination of his care.

## 2016-08-20 ENCOUNTER — Other Ambulatory Visit: Payer: Self-pay | Admitting: Infectious Disease

## 2016-08-20 LAB — HEPATITIS B SURFACE ANTIBODY,QUALITATIVE: HEP B S AB: NEGATIVE

## 2016-08-20 LAB — HIV ANTIBODY (ROUTINE TESTING W REFLEX): HIV 1&2 Ab, 4th Generation: NONREACTIVE

## 2016-08-20 LAB — HEPATITIS A ANTIBODY, TOTAL: HEP A TOTAL AB: NONREACTIVE

## 2016-08-20 LAB — HEPATITIS C ANTIBODY: HCV Ab: NEGATIVE

## 2016-08-20 LAB — HEPATITIS B SURFACE ANTIGEN: Hepatitis B Surface Ag: NEGATIVE

## 2016-08-20 MED ORDER — ISONIAZID 300 MG PO TABS: 300.0000 mg | ORAL_TABLET | Freq: Every day | ORAL | 8 refills | Status: AC

## 2016-08-20 MED ORDER — B-6 50 MG PO TABS: 50.0000 mg | ORAL_TABLET | Freq: Every day | ORAL | 8 refills | Status: DC

## 2016-08-20 NOTE — Progress Notes (Signed)
Patients CXR clear. Rx for INH and B6 sent in computer  He should pick up and start rx for LTB

## 2016-08-21 DIAGNOSIS — Z79891 Long term (current) use of opiate analgesic: Secondary | ICD-10-CM | POA: Diagnosis not present

## 2016-08-21 DIAGNOSIS — R51 Headache: Secondary | ICD-10-CM | POA: Diagnosis not present

## 2016-08-21 DIAGNOSIS — M25561 Pain in right knee: Secondary | ICD-10-CM | POA: Diagnosis not present

## 2016-08-21 DIAGNOSIS — M545 Low back pain: Secondary | ICD-10-CM | POA: Diagnosis not present

## 2016-08-21 DIAGNOSIS — G890 Central pain syndrome: Secondary | ICD-10-CM | POA: Diagnosis not present

## 2016-08-21 DIAGNOSIS — G894 Chronic pain syndrome: Secondary | ICD-10-CM | POA: Diagnosis not present

## 2016-08-21 DIAGNOSIS — M25562 Pain in left knee: Secondary | ICD-10-CM | POA: Diagnosis not present

## 2016-08-21 NOTE — Progress Notes (Signed)
Left message with results and instructions for patient. Thanks! Landis Gandy, RN

## 2016-08-21 NOTE — Progress Notes (Signed)
Excellent

## 2016-08-28 ENCOUNTER — Other Ambulatory Visit: Payer: Self-pay | Admitting: Neurological Surgery

## 2016-08-28 DIAGNOSIS — M4722 Other spondylosis with radiculopathy, cervical region: Secondary | ICD-10-CM

## 2016-09-04 ENCOUNTER — Ambulatory Visit (INDEPENDENT_AMBULATORY_CARE_PROVIDER_SITE_OTHER): Payer: PPO | Admitting: Unknown Physician Specialty

## 2016-09-04 ENCOUNTER — Encounter: Payer: Self-pay | Admitting: Unknown Physician Specialty

## 2016-09-04 DIAGNOSIS — G4700 Insomnia, unspecified: Secondary | ICD-10-CM

## 2016-09-04 DIAGNOSIS — R110 Nausea: Secondary | ICD-10-CM | POA: Diagnosis not present

## 2016-09-04 MED ORDER — QUETIAPINE FUMARATE 100 MG PO TABS: 100.0000 mg | ORAL_TABLET | Freq: Every day | ORAL | 1 refills | Status: DC

## 2016-09-04 MED ORDER — ONDANSETRON HCL 4 MG PO TABS: 4.0000 mg | ORAL_TABLET | Freq: Three times a day (TID) | ORAL | 6 refills | Status: DC | PRN

## 2016-09-04 NOTE — Assessment & Plan Note (Signed)
Add Seroquel 100 mg.  Asked to break in 1/2

## 2016-09-04 NOTE — Assessment & Plan Note (Signed)
Rx for Zofran

## 2016-09-04 NOTE — Progress Notes (Signed)
BP 120/74 (BP Location: Left Arm, Patient Position: Sitting, Cuff Size: Large)   Pulse 73   Temp 98.2 F (36.8 C)   Ht 5' 11.3" (1.811 m)   Wt 196 lb 12.8 oz (89.3 kg)   SpO2 98%   BMI 27.22 kg/m    Subjective:    Patient ID: Gregory Cervantes, male    DOB: [birth year], 60 y.o.   MRN: [id number]  HPI: Gregory Cervantes is a 60 y.o. male  Chief Complaint  Patient presents with  . Insomnia    pt states he would like something to help him sleep, states he has had problems with this for years   Insomnia Pt states he has a lot of problems with sleep.  He has been given Xanax and Seroquel for sleep.  He is taking 5 15 mg of oxycodone for sleep.  He generally goes to bed at 12:30 and wakes up at 4p  Nausea Ongoing for about 2 years.  Brought in 3 kinds of nausea medications in a bottle marked Zofran.  One pill is 4 mg Zofran and the other is 8 mg Zofran.  He states these are maintenance medications.  Not sure what the other medications are.  Weight is stable.    Pt states he needs something for his sleep and something for his nausea.   Relevant past medical, surgical, family and social history reviewed and updated as indicated. Interim medical history since our last visit reviewed. Allergies and medications reviewed and updated.  Review of Systems  Per HPI unless specifically indicated above     Objective:    BP 120/74 (BP Location: Left Arm, Patient Position: Sitting, Cuff Size: Large)   Pulse 73   Temp 98.2 F (36.8 C)   Ht 5' 11.3" (1.811 m)   Wt 196 lb 12.8 oz (89.3 kg)   SpO2 98%   BMI 27.22 kg/m   Wt Readings from Last 3 Encounters:  09/04/16 196 lb 12.8 oz (89.3 kg)  08/19/16 195 lb (88.5 kg)  08/04/16 197 lb 3.2 oz (89.4 kg)    Physical Exam  Constitutional: He is oriented to person, place, and time. He appears well-developed and well-nourished. No distress.  HENT:  Head: Normocephalic and atraumatic.  Eyes: Conjunctivae and lids are normal. Right eye exhibits  no discharge. Left eye exhibits no discharge. No scleral icterus.  Cardiovascular: Normal rate.   Pulmonary/Chest: Effort normal.  Abdominal: Normal appearance. There is no splenomegaly or hepatomegaly.  Musculoskeletal: Normal range of motion.  Neurological: He is alert and oriented to person, place, and time.  Skin: Skin is intact. No rash noted. No pallor.  Psychiatric: He has a normal mood and affect. His behavior is normal. Judgment and thought content normal.    Results for orders placed or performed in visit on 08/19/16  HIV antibody  Result Value Ref Range   HIV 1&2 Ab, 4th Generation NONREACTIVE NONREACTIVE  Hepatitis C Antibody  Result Value Ref Range   HCV Ab NEGATIVE NEGATIVE  Hepatitis B Surface AntiGEN  Result Value Ref Range   Hepatitis B Surface Ag NEGATIVE NEGATIVE  Hepatitis B Surface AntiBODY  Result Value Ref Range   Hep B S Ab NEG NEGATIVE  Hepatitis A Ab, Total  Result Value Ref Range   Hep A Total Ab NON REACTIVE NON REACTIVE  COMPLETE METABOLIC PANEL WITH GFR  Result Value Ref Range   Sodium 138 135 - 146 mmol/L   Potassium 4.6 3.5 - 5.3 mmol/L  Chloride 107 98 - 110 mmol/L   CO2 24 20 - 31 mmol/L   Glucose, Bld 86 65 - 99 mg/dL   BUN 14 7 - 25 mg/dL   Creat 0.82 0.70 - 1.33 mg/dL   Total Bilirubin 0.2 0.2 - 1.2 mg/dL   Alkaline Phosphatase 44 40 - 115 U/L   AST 22 10 - 35 U/L   ALT 17 9 - 46 U/L   Total Protein 6.4 6.1 - 8.1 g/dL   Albumin 3.9 3.6 - 5.1 g/dL   Calcium 9.1 8.6 - 10.3 mg/dL   GFR, Est African American >89 >=60 mL/min   GFR, Est Non African American >89 >=60 mL/min      Assessment & Plan:   Problem List Items Addressed This Visit      Unprioritized   Insomnia    Add Seroquel 100 mg.  Asked to break in 1/2      Nausea    Rx for Zofran       Other Visit Diagnoses   None.      Follow up plan: No Follow-up on file.

## 2016-09-05 ENCOUNTER — Ambulatory Visit
Admission: RE | Admit: 2016-09-05 | Discharge: 2016-09-05 | Disposition: A | Discharge status: Home / Self Care | Payer: PPO | Source: Ambulatory Visit | Attending: Neurological Surgery | Admitting: Neurological Surgery

## 2016-09-05 DIAGNOSIS — M4722 Other spondylosis with radiculopathy, cervical region: Secondary | ICD-10-CM

## 2016-09-05 DIAGNOSIS — M4802 Spinal stenosis, cervical region: Secondary | ICD-10-CM | POA: Diagnosis not present

## 2016-09-16 DIAGNOSIS — R51 Headache: Secondary | ICD-10-CM | POA: Diagnosis not present

## 2016-09-16 DIAGNOSIS — M79672 Pain in left foot: Secondary | ICD-10-CM | POA: Diagnosis not present

## 2016-09-16 DIAGNOSIS — M4722 Other spondylosis with radiculopathy, cervical region: Secondary | ICD-10-CM | POA: Diagnosis not present

## 2016-09-16 DIAGNOSIS — M542 Cervicalgia: Secondary | ICD-10-CM | POA: Diagnosis not present

## 2016-09-16 DIAGNOSIS — G894 Chronic pain syndrome: Secondary | ICD-10-CM | POA: Diagnosis not present

## 2016-09-16 DIAGNOSIS — M545 Low back pain: Secondary | ICD-10-CM | POA: Diagnosis not present

## 2016-09-16 DIAGNOSIS — M79671 Pain in right foot: Secondary | ICD-10-CM | POA: Diagnosis not present

## 2016-09-16 DIAGNOSIS — Z79891 Long term (current) use of opiate analgesic: Secondary | ICD-10-CM | POA: Diagnosis not present

## 2016-09-28 DIAGNOSIS — R6882 Decreased libido: Secondary | ICD-10-CM | POA: Diagnosis not present

## 2016-09-28 DIAGNOSIS — E2749 Other adrenocortical insufficiency: Secondary | ICD-10-CM | POA: Diagnosis not present

## 2016-09-29 DIAGNOSIS — F1190 Opioid use, unspecified, uncomplicated: Secondary | ICD-10-CM | POA: Diagnosis not present

## 2016-09-29 DIAGNOSIS — E2749 Other adrenocortical insufficiency: Secondary | ICD-10-CM | POA: Diagnosis not present

## 2016-09-29 DIAGNOSIS — R5382 Chronic fatigue, unspecified: Secondary | ICD-10-CM | POA: Diagnosis not present

## 2016-10-02 ENCOUNTER — Encounter: Payer: Self-pay | Admitting: Unknown Physician Specialty

## 2016-10-02 ENCOUNTER — Ambulatory Visit (INDEPENDENT_AMBULATORY_CARE_PROVIDER_SITE_OTHER): Payer: PPO | Admitting: Unknown Physician Specialty

## 2016-10-02 VITALS — BP 122/74 | HR 66 | Temp 98.2°F | Wt 195.0 lb

## 2016-10-02 DIAGNOSIS — M545 Low back pain: Secondary | ICD-10-CM | POA: Diagnosis not present

## 2016-10-02 DIAGNOSIS — F5101 Primary insomnia: Secondary | ICD-10-CM | POA: Diagnosis not present

## 2016-10-02 DIAGNOSIS — G8929 Other chronic pain: Secondary | ICD-10-CM

## 2016-10-02 DIAGNOSIS — R5382 Chronic fatigue, unspecified: Secondary | ICD-10-CM | POA: Diagnosis not present

## 2016-10-02 DIAGNOSIS — M2550 Pain in unspecified joint: Secondary | ICD-10-CM | POA: Diagnosis not present

## 2016-10-02 NOTE — Progress Notes (Signed)
BP 122/74   Pulse 66   Temp 98.2 F (36.8 C)   Wt 195 lb (88.5 kg)   SpO2 98%   BMI 26.97 kg/m    Subjective:    Patient ID: Gregory Cervantes, male    DOB: [birth year], 60 y.o.   MRN: [id number]  HPI: Gregory Cervantes is a 60 y.o. male  Chief Complaint  Patient presents with  . Follow-up    1 MONTH CHECK   Fatigue and joint pain Pt states his biggest issue is fatigue and every joint in his body hurts.  This is ongoing for 2 years.  He has seen a number of specialists including a rheumatologist.  He is seeing an Endocrine and is back to normal  Insomnia Pt states the Seroquel is helping.   Pan management Wants a second opinion as they are discussing a 2 level fusion   Nausea medication not being covered.  Pt not sure why  Relevant past medical, surgical, family and social history reviewed and updated as indicated. Interim medical history since our last visit reviewed. Allergies and medications reviewed and updated.  Review of Systems  Per HPI unless specifically indicated above     Objective:    BP 122/74   Pulse 66   Temp 98.2 F (36.8 C)   Wt 195 lb (88.5 kg)   SpO2 98%   BMI 26.97 kg/m   Wt Readings from Last 3 Encounters:  10/02/16 195 lb (88.5 kg)  09/04/16 196 lb 12.8 oz (89.3 kg)  08/19/16 195 lb (88.5 kg)    Physical Exam  Constitutional: He is oriented to person, place, and time. He appears well-developed and well-nourished. No distress.  HENT:  Head: Normocephalic and atraumatic.  Eyes: Conjunctivae and lids are normal. Right eye exhibits no discharge. Left eye exhibits no discharge. No scleral icterus.  Neck: Normal range of motion. Neck supple. No JVD present. Carotid bruit is not present.  Cardiovascular: Normal rate, regular rhythm and normal heart sounds.   Pulmonary/Chest: Effort normal and breath sounds normal. No respiratory distress.  Abdominal: Normal appearance. There is no splenomegaly or hepatomegaly.  Musculoskeletal: Normal range  of motion.  Neurological: He is alert and oriented to person, place, and time.  Skin: Skin is warm, dry and intact. No rash noted. No pallor.  Psychiatric: He has a normal mood and affect. His behavior is normal. Judgment and thought content normal.    Results for orders placed or performed in visit on 08/19/16  HIV antibody  Result Value Ref Range   HIV 1&2 Ab, 4th Generation NONREACTIVE NONREACTIVE  Hepatitis C Antibody  Result Value Ref Range   HCV Ab NEGATIVE NEGATIVE  Hepatitis B Surface AntiGEN  Result Value Ref Range   Hepatitis B Surface Ag NEGATIVE NEGATIVE  Hepatitis B Surface AntiBODY  Result Value Ref Range   Hep B S Ab NEG NEGATIVE  Hepatitis A Ab, Total  Result Value Ref Range   Hep A Total Ab NON REACTIVE NON REACTIVE  COMPLETE METABOLIC PANEL WITH GFR  Result Value Ref Range   Sodium 138 135 - 146 mmol/L   Potassium 4.6 3.5 - 5.3 mmol/L   Chloride 107 98 - 110 mmol/L   CO2 24 20 - 31 mmol/L   Glucose, Bld 86 65 - 99 mg/dL   BUN 14 7 - 25 mg/dL   Creat 0.82 0.70 - 1.33 mg/dL   Total Bilirubin 0.2 0.2 - 1.2 mg/dL   Alkaline Phosphatase 44 40 -  115 U/L   AST 22 10 - 35 U/L   ALT 17 9 - 46 U/L   Total Protein 6.4 6.1 - 8.1 g/dL   Albumin 3.9 3.6 - 5.1 g/dL   Calcium 9.1 8.6 - 10.3 mg/dL   GFR, Est African American >89 >=60 mL/min   GFR, Est Non African American >89 >=60 mL/min      Assessment & Plan:   Problem List Items Addressed This Visit      Unprioritized   Arthralgia of multiple joints - Primary   Relevant Orders   Ambulatory referral to Neurosurgery   Ambulatory referral to Rheumatology   Back ache   Relevant Orders   Ambulatory referral to Neurosurgery   Ambulatory referral to Rheumatology   Chronic fatigue    Pt states fatigue and pain all over his body is his biggest problem.  Refer back to rhumatology      Relevant Orders   Ambulatory referral to Rheumatology   Insomnia   Relevant Orders   B12    Other Visit Diagnoses   None.        Follow up plan: Return in about 3 months (around 01/02/2017).

## 2016-10-02 NOTE — Assessment & Plan Note (Signed)
Pt states fatigue and pain all over his body is his biggest problem.  Refer back to rhumatology

## 2016-10-03 LAB — VITAMIN B12: Vitamin B-12: 815 pg/mL (ref 211–946)

## 2016-10-05 ENCOUNTER — Encounter: Payer: Self-pay | Admitting: Unknown Physician Specialty

## 2016-10-13 DIAGNOSIS — M79672 Pain in left foot: Secondary | ICD-10-CM | POA: Diagnosis not present

## 2016-10-13 DIAGNOSIS — M25561 Pain in right knee: Secondary | ICD-10-CM | POA: Diagnosis not present

## 2016-10-13 DIAGNOSIS — Z79891 Long term (current) use of opiate analgesic: Secondary | ICD-10-CM | POA: Diagnosis not present

## 2016-10-13 DIAGNOSIS — G890 Central pain syndrome: Secondary | ICD-10-CM | POA: Diagnosis not present

## 2016-10-13 DIAGNOSIS — M25562 Pain in left knee: Secondary | ICD-10-CM | POA: Diagnosis not present

## 2016-10-13 DIAGNOSIS — M79671 Pain in right foot: Secondary | ICD-10-CM | POA: Diagnosis not present

## 2016-10-13 DIAGNOSIS — G894 Chronic pain syndrome: Secondary | ICD-10-CM | POA: Diagnosis not present

## 2016-10-13 DIAGNOSIS — R51 Headache: Secondary | ICD-10-CM | POA: Diagnosis not present

## 2016-10-20 ENCOUNTER — Ambulatory Visit: Payer: PPO | Admitting: Unknown Physician Specialty

## 2016-10-20 DIAGNOSIS — M545 Low back pain: Secondary | ICD-10-CM | POA: Diagnosis not present

## 2016-10-20 DIAGNOSIS — M79671 Pain in right foot: Secondary | ICD-10-CM | POA: Diagnosis not present

## 2016-10-20 DIAGNOSIS — M25561 Pain in right knee: Secondary | ICD-10-CM | POA: Diagnosis not present

## 2016-10-20 DIAGNOSIS — G894 Chronic pain syndrome: Secondary | ICD-10-CM | POA: Diagnosis not present

## 2016-10-20 DIAGNOSIS — G890 Central pain syndrome: Secondary | ICD-10-CM | POA: Diagnosis not present

## 2016-10-20 DIAGNOSIS — Z79891 Long term (current) use of opiate analgesic: Secondary | ICD-10-CM | POA: Diagnosis not present

## 2016-10-20 DIAGNOSIS — M79672 Pain in left foot: Secondary | ICD-10-CM | POA: Diagnosis not present

## 2016-10-20 DIAGNOSIS — R51 Headache: Secondary | ICD-10-CM | POA: Diagnosis not present

## 2016-10-20 DIAGNOSIS — M25562 Pain in left knee: Secondary | ICD-10-CM | POA: Diagnosis not present

## 2016-10-26 ENCOUNTER — Ambulatory Visit (INDEPENDENT_AMBULATORY_CARE_PROVIDER_SITE_OTHER): Payer: PPO | Admitting: Unknown Physician Specialty

## 2016-10-26 ENCOUNTER — Encounter: Payer: Self-pay | Admitting: Unknown Physician Specialty

## 2016-10-26 DIAGNOSIS — M545 Low back pain: Secondary | ICD-10-CM

## 2016-10-26 DIAGNOSIS — R110 Nausea: Secondary | ICD-10-CM

## 2016-10-26 DIAGNOSIS — G8929 Other chronic pain: Secondary | ICD-10-CM | POA: Diagnosis not present

## 2016-10-26 MED ORDER — ONDANSETRON HCL 4 MG PO TABS: 4.0000 mg | ORAL_TABLET | Freq: Three times a day (TID) | ORAL | 6 refills | Status: DC | PRN

## 2016-10-26 NOTE — Assessment & Plan Note (Addendum)
Discussed with pt with chronic pain.  I am sending him for a rheumatology referral.  Long discussion about pain management and current pain theories and relationship of Opioids and chronic pain

## 2016-10-26 NOTE — Progress Notes (Signed)
-    BP 111/74 (BP Location: Left Arm, Patient Position: Sitting, Cuff Size: Normal)   Pulse 83   Temp 98.4 F (36.9 C)   Wt 192 lb 9.6 oz (87.4 kg)   SpO2 97%   BMI 26.64 kg/m    Subjective:    Patient ID: Gregory Cervantes, male    DOB: Jul 13, 1956, 60 y.o.   MRN: [id number]  HPI: Gregory Cervantes is a 60 y.o. male  Chief Complaint  Patient presents with  . Referral    pt states he would like a referral to pain management  . Nausea    pt states he would like something for nausea    Pain management Pt is seeing currently going to pain management   He is getting frustrated with his present situation.  He states he wants a "real one."   He is currently frustrated with his present situation.    Nausea  He would like another refill   Relevant past medical, surgical, family and social history reviewed and updated as indicated. Interim medical history since our last visit reviewed. Allergies and medications reviewed and updated.  Review of Systems  Per HPI unless specifically indicated above     Objective:    BP 111/74 (BP Location: Left Arm, Patient Position: Sitting, Cuff Size: Normal)   Pulse 83   Temp 98.4 F (36.9 C)   Wt 192 lb 9.6 oz (87.4 kg)   SpO2 97%   BMI 26.64 kg/m   Wt Readings from Last 3 Encounters:  10/26/16 192 lb 9.6 oz (87.4 kg)  10/02/16 195 lb (88.5 kg)  09/04/16 196 lb 12.8 oz (89.3 kg)    Physical Exam  Constitutional: He is oriented to person, place, and time. He appears well-developed and well-nourished. No distress.  HENT:  Head: Normocephalic and atraumatic.  Eyes: Conjunctivae and lids are normal. Right eye exhibits no discharge. Left eye exhibits no discharge. No scleral icterus.  Neck: Normal range of motion. Neck supple. No JVD present. Carotid bruit is not present.  Cardiovascular: Normal rate, regular rhythm and normal heart sounds.   Pulmonary/Chest: Effort normal and breath sounds normal. No respiratory distress.  Abdominal:  Normal appearance. There is no splenomegaly or hepatomegaly.  Musculoskeletal: Normal range of motion.  Neurological: He is alert and oriented to person, place, and time.  Skin: Skin is warm, dry and intact. No rash noted. No pallor.  Psychiatric: He has a normal mood and affect. His behavior is normal. Judgment and thought content normal.    Results for orders placed or performed in visit on 10/02/16  B12  Result Value Ref Range   Vitamin B-12 815 211 - 946 pg/mL      Assessment & Plan:   Problem List Items Addressed This Visit      Unprioritized   Back pain, chronic    Discussed with pt with chronic pain.  I am sending him for a rheumatology referral.  Long discussion about pain management and current pain theories and relationship of Opioids and chronic pain      Relevant Medications   Oxycodone HCl 10 MG TABS   Nausea    Pt would like a new nausea medication.            Follow up plan: Return in about 3 months (around 01/26/2017).

## 2016-10-26 NOTE — Assessment & Plan Note (Signed)
Pt would like a new nausea medication.

## 2016-10-27 ENCOUNTER — Telehealth: Payer: Self-pay

## 2016-10-27 NOTE — Telephone Encounter (Signed)
-----   Message from Charlynn Court sent at 10/26/2016  1:36 PM EST ----- I spoke to the Patient about his Rheumatology referral and the balance he owed at [facility] and they wouldn't schedule his appt. He wanted me to just do his Neurosurgery appt for now.  ----- Message ----- From: Moss Mc, [place]: 10/26/2016   1:09 PM To: [place]. This patient is in our office for an appointment and states he has not heard anything about his neurosurgery or rheumatology appointment. I see that notes were faxed to [facility] so I looked up their phone number and gave it to them to call. I hope that was OK. For rheumatology, I do not see where it was sent or anything. What do we or the patient need to do in order for him to get in with them?

## 2016-10-27 NOTE — Telephone Encounter (Signed)
Opened in error

## 2016-10-27 NOTE — Telephone Encounter (Signed)
Tried calling patient, went straight to VM the first time. Tried again and got a busy tone. Will try to call again later.

## 2016-10-28 NOTE — Telephone Encounter (Signed)
Called and left patient a VM asking for him to please return my call.  

## 2016-10-29 ENCOUNTER — Other Ambulatory Visit: Payer: Self-pay | Admitting: Unknown Physician Specialty

## 2016-10-30 NOTE — Telephone Encounter (Signed)
Called and left patient a VM asking for patient to please return my call.  

## 2016-11-02 DIAGNOSIS — Z981 Arthrodesis status: Secondary | ICD-10-CM | POA: Diagnosis not present

## 2016-11-02 DIAGNOSIS — F3289 Other specified depressive episodes: Secondary | ICD-10-CM | POA: Diagnosis not present

## 2016-11-02 DIAGNOSIS — G8929 Other chronic pain: Secondary | ICD-10-CM | POA: Diagnosis not present

## 2016-11-02 DIAGNOSIS — M4316 Spondylolisthesis, lumbar region: Secondary | ICD-10-CM | POA: Diagnosis not present

## 2016-11-02 DIAGNOSIS — G63 Polyneuropathy in diseases classified elsewhere: Secondary | ICD-10-CM | POA: Diagnosis not present

## 2016-11-02 DIAGNOSIS — M4312 Spondylolisthesis, cervical region: Secondary | ICD-10-CM | POA: Diagnosis not present

## 2016-11-18 ENCOUNTER — Ambulatory Visit (INDEPENDENT_AMBULATORY_CARE_PROVIDER_SITE_OTHER): Payer: PPO | Admitting: Infectious Disease

## 2016-11-18 ENCOUNTER — Encounter: Payer: Self-pay | Admitting: Infectious Disease

## 2016-11-18 VITALS — BP 117/77 | HR 65 | Temp 97.6°F | Wt 193.0 lb

## 2016-11-18 DIAGNOSIS — M545 Low back pain, unspecified: Secondary | ICD-10-CM

## 2016-11-18 DIAGNOSIS — R61 Generalized hyperhidrosis: Secondary | ICD-10-CM

## 2016-11-18 DIAGNOSIS — Z227 Latent tuberculosis: Secondary | ICD-10-CM

## 2016-11-18 DIAGNOSIS — M2550 Pain in unspecified joint: Secondary | ICD-10-CM

## 2016-11-18 DIAGNOSIS — R7611 Nonspecific reaction to tuberculin skin test without active tuberculosis: Secondary | ICD-10-CM | POA: Diagnosis not present

## 2016-11-18 DIAGNOSIS — R634 Abnormal weight loss: Secondary | ICD-10-CM | POA: Diagnosis not present

## 2016-11-18 DIAGNOSIS — G8929 Other chronic pain: Secondary | ICD-10-CM

## 2016-11-18 DIAGNOSIS — R6881 Early satiety: Secondary | ICD-10-CM | POA: Diagnosis not present

## 2016-11-18 DIAGNOSIS — E2749 Other adrenocortical insufficiency: Secondary | ICD-10-CM

## 2016-11-18 DIAGNOSIS — F17200 Nicotine dependence, unspecified, uncomplicated: Secondary | ICD-10-CM

## 2016-11-18 HISTORY — DX: Abnormal weight loss: R63.4

## 2016-11-18 HISTORY — DX: Early satiety: R68.81

## 2016-11-18 LAB — CBC WITH DIFFERENTIAL/PLATELET
BASOS PCT: 0 %
Basophils Absolute: 0 cells/uL (ref 0–200)
EOS PCT: 3 %
Eosinophils Absolute: 240 cells/uL (ref 15–500)
HEMATOCRIT: 43.3 % (ref 38.5–50.0)
HEMOGLOBIN: 14.4 g/dL (ref 13.2–17.1)
LYMPHS ABS: 2240 {cells}/uL (ref 850–3900)
Lymphocytes Relative: 28 %
MCH: 31 pg (ref 27.0–33.0)
MCHC: 33.3 g/dL (ref 32.0–36.0)
MCV: 93.3 fL (ref 80.0–100.0)
MONO ABS: 720 {cells}/uL (ref 200–950)
MPV: 9.2 fL (ref 7.5–12.5)
Monocytes Relative: 9 %
NEUTROS ABS: 4800 {cells}/uL (ref 1500–7800)
NEUTROS PCT: 60 %
Platelets: 233 10*3/uL (ref 140–400)
RBC: 4.64 MIL/uL (ref 4.20–5.80)
RDW: 14.8 % (ref 11.0–15.0)
WBC: 8 10*3/uL (ref 3.8–10.8)

## 2016-11-18 LAB — COMPLETE METABOLIC PANEL WITH GFR
ALBUMIN: 3.9 g/dL (ref 3.6–5.1)
ALK PHOS: 42 U/L (ref 40–115)
ALT: 10 U/L (ref 9–46)
AST: 21 U/L (ref 10–35)
BUN: 13 mg/dL (ref 7–25)
CALCIUM: 9 mg/dL (ref 8.6–10.3)
CHLORIDE: 106 mmol/L (ref 98–110)
CO2: 22 mmol/L (ref 20–31)
Creat: 0.78 mg/dL (ref 0.70–1.33)
GFR, Est African American: >89 mL/min (ref >=60)
Glucose, Bld: 96 mg/dL (ref 65–99)
POTASSIUM: 4.4 mmol/L (ref 3.5–5.3)
Sodium: 139 mmol/L (ref 135–146)
Total Bilirubin: 0.3 mg/dL (ref 0.2–1.2)
Total Protein: 6.2 g/dL (ref 6.1–8.1)

## 2016-11-18 NOTE — Progress Notes (Signed)
Chief complaint "I feel like death" he is complaining of multiple joint pains early satiety drenching night sweats small degree of weight loss chronic abdominal pain chronic lower back pain  Subjective:    Patient ID: Gregory Cervantes, male    DOB: 1956/10/23, 60 y.o.   MRN: [id number]  HPI  60 year old man with mx medical problems including rheumatological condition that has not yet been clarified, adrenal insufficiency who has felt poorly for the past 3 years. Predominant symptoms have been night time and daytime sweats, but no fevers. He had QF done which was positive. He was referred to [facility] and seen by Dr. Ralene Ok who had seen patient previously for possible oral candidiasis.   Dr. Ralene Ok saw the patient this April and repeated QF gold which was + though he could not trace a specific risk factor other than mx hospitalizations. Patient has had "twenty surgeries" over the years.   Only other predominant symptom is early satiety.   Dr. Ralene Ok referred the pt to [facility] for rx of LTB and it appears they tried to give INH and rifapentine but he did not tolerate even the first dose. He is still seeing Rheumatology at [facility] but from reading their notes there is not imminent plan for TNF alpha antagonist. He is instead only on steroids. He denies fevers and had been gaining rather than losing weight. Today he states that over that his symptoms have worsened any actually is starting to lose some weight. I did not find any evidence of a CT of the chest and abdomen having been done so offered to try to get those done for him. Has occasional nonproductive dry cough. His chest x-ray here previously was normal. He is tolerating his treatment for LTB so far  Past Medical History:  Diagnosis Date  . Arthritis   . BPH (benign prostatic hyperplasia)   . Chronic back pain   . Headache   . Insomnia   . Night sweat 08/19/2016  . Peripheral neuropathy ([place])   . Smoker 08/19/2016  . TB  lung, latent 08/19/2016  . Weight gain 08/19/2016    Past Surgical History:  Procedure Laterality Date  . BACK SURGERY    . BRAIN SURGERY     x5- spinal fluid leaks  . CERVICAL FUSION    . DG SHOULDER RIGHT COMPLETE     x6  . HAND SURGERY Left   . HERNIA REPAIR    . KNEE ARTHROSCOPY Left    x3  . PROSTATE SURGERY    . WISDOM TOOTH EXTRACTION      Family History  Problem Relation Age of Onset  . Cancer Mother     pancreatic and lung  . Cancer Father     leukemia  . Anxiety disorder Sister   . Anxiety disorder Sister   . Anxiety disorder Sister       Social History   Social History  . Marital status: Divorced    Spouse name: N/A  . Number of children: N/A  . Years of education: N/A   Social History Main Topics  . Smoking status: Current Every Day Smoker    Packs/day: 0.50    Types: Cigarettes  . Smokeless tobacco: Never Used  . Alcohol use No     Comment: three times a year  . Drug use: No  . Sexual activity: No   Other Topics Concern  . Not on file   Social History Narrative  . No  narrative on file    No Known Allergies   Current Outpatient Prescriptions:  .  b complex vitamins tablet, Take 1 tablet by mouth daily., Disp: , Rfl:  .  gabapentin (NEURONTIN) 400 MG capsule, Take 400 mg by mouth 4 (four) times daily. , Disp: , Rfl:  .  isoniazid (NYDRAZID) 300 MG tablet, Take 300 mg by mouth daily., Disp: , Rfl: 8 .  Multiple Vitamin (MULTIVITAMIN) capsule, Take 1 capsule by mouth daily., Disp: , Rfl:  .  naloxone (NARCAN) 0.4 MG/ML injection, Inject 0.4 mg into the muscle as needed., Disp: , Rfl:  .  ondansetron (ZOFRAN) 4 MG tablet, Take 1 tablet (4 mg total) by mouth every 8 (eight) hours as needed for nausea or vomiting., Disp: 30 tablet, Rfl: 6 .  Oxycodone HCl 10 MG TABS, Take 10 mg by mouth every 8 (eight) hours., Disp: , Rfl: 0 .  Pyridoxine HCl (B-6) 50 MG TABS, Take 50 mg by mouth daily., Disp: 30 tablet, Rfl: 8 .  QUEtiapine (SEROQUEL) 100 MG  tablet, TAKE 1 TABLET (100 MG TOTAL) BY MOUTH AT BEDTIME., Disp: 30 tablet, Rfl: 1 .  SUMAtriptan (IMITREX) 6 MG/0.5ML SOLN injection, Inject 0.6 mg into the muscle as needed., Disp: , Rfl:    Review of Systems  Constitutional: Positive for appetite change, diaphoresis, fatigue and unexpected weight change. Negative for chills and fever.  HENT: Negative for congestion and sore throat.   Eyes: Negative for photophobia.  Respiratory: Positive for cough. Negative for shortness of breath and wheezing.   Cardiovascular: Negative for chest pain, palpitations and leg swelling.  Gastrointestinal: Negative for abdominal pain, blood in stool, constipation, diarrhea, nausea and vomiting.  Genitourinary: Negative for dysuria, flank pain and hematuria.  Musculoskeletal: Positive for arthralgias, back pain and myalgias.  Skin: Negative for rash.  Neurological: Negative for dizziness, weakness and headaches.  Hematological: Does not bruise/bleed easily.  Psychiatric/Behavioral: Negative for suicidal ideas.       Objective:   Physical Exam  Constitutional: He appears well-developed and well-nourished.  HENT:  Head: Atraumatic.  Right Ear: External ear normal.  Mouth/Throat: No oropharyngeal exudate.  Eyes: Conjunctivae and EOM are normal.  Neck: Normal range of motion. Neck supple. No JVD present.  Cardiovascular: Normal rate, regular rhythm and normal heart sounds.  Exam reveals no gallop and no friction rub.   No murmur heard. Pulmonary/Chest: Effort normal and breath sounds normal. No respiratory distress. He has no wheezes. He has no rales.  Abdominal: Soft. Bowel sounds are normal. He exhibits no distension. There is no tenderness. There is no rebound and no guarding.  Musculoskeletal: Normal range of motion. He exhibits no edema, tenderness or deformity.  Lymphadenopathy:    He has no cervical adenopathy.  Neurological: He is alert.  Psychiatric: His speech is normal and behavior is  normal. Thought content normal. Cognition and memory are normal. He exhibits a depressed mood.          Assessment & Plan:   #1 Latent TB: Continue therapy    #2 Night time and daytime sweating and early satiety: could be related to his CTD and or AI. He has seen GI and he has had a scan of the bill belly in 2014 hour pedis CT of the abdomen pelvis now as well as a CT the chest with contrast.  #3 Smoker: encouraged him to quit.  #4 early satiety: See above Will get CAT scan abdomen pelvis and he is seeing GI  I spent  greater than 40  minutes with the patient including greater than 50% of time in face to face counsel of the patient re his LTB, night time and daytime sweating, early satiety CTD, AI and in coordination of his care.

## 2016-11-19 LAB — SEDIMENTATION RATE: SED RATE: 32 mm/h — AB (ref 0–20)

## 2016-11-19 LAB — C-REACTIVE PROTEIN: CRP: 28 mg/L — AB (ref <8.0)

## 2016-11-23 DIAGNOSIS — M4312 Spondylolisthesis, cervical region: Secondary | ICD-10-CM | POA: Diagnosis not present

## 2016-11-23 DIAGNOSIS — G8929 Other chronic pain: Secondary | ICD-10-CM | POA: Diagnosis not present

## 2016-11-23 DIAGNOSIS — Z981 Arthrodesis status: Secondary | ICD-10-CM | POA: Diagnosis not present

## 2016-11-23 DIAGNOSIS — I2699 Other pulmonary embolism without acute cor pulmonale: Secondary | ICD-10-CM | POA: Diagnosis not present

## 2016-11-23 DIAGNOSIS — F3289 Other specified depressive episodes: Secondary | ICD-10-CM | POA: Diagnosis not present

## 2016-11-23 DIAGNOSIS — G63 Polyneuropathy in diseases classified elsewhere: Secondary | ICD-10-CM | POA: Diagnosis not present

## 2016-11-23 DIAGNOSIS — M4316 Spondylolisthesis, lumbar region: Secondary | ICD-10-CM | POA: Diagnosis not present

## 2016-11-24 ENCOUNTER — Telehealth: Payer: Self-pay

## 2016-11-24 NOTE — Telephone Encounter (Signed)
Attempted to call patient to notify him of his appointment at [facility] on 12/02/16 @ 1030. He is to be NPO 4 hours before appointment. He must go to the facility and pick up 2 bottles of contrast. Patient voicemail was full. Rodman Key, LPN

## 2016-12-02 ENCOUNTER — Ambulatory Visit: Admission: RE | Admit: 2016-12-02 | Payer: PPO | Source: Ambulatory Visit

## 2016-12-03 ENCOUNTER — Telehealth: Payer: Self-pay

## 2016-12-03 DIAGNOSIS — Z227 Latent tuberculosis: Secondary | ICD-10-CM

## 2016-12-03 NOTE — Telephone Encounter (Signed)
Radiology called with need for pre authorization for  CT scan of abdomen and pelvis .  Messages were left on voice mail and just retrieved.   Patients X-rays were cancelled.  I will call to submit prior authorization with [employer].   Forms requested, received and faxed to [employer] .  Laverle Patter, RN

## 2016-12-04 NOTE — Telephone Encounter (Signed)
Approval received for CT of chest with and without contrast.  Radiology called and initial order was CT of chest and pelvis without contrast and there was also a order for CT of chest . I have resubmitted form for CT of chest approval.   Changed initial order to CT chest and pelvis with and without contrast.   Laverle Patter, RN

## 2016-12-09 ENCOUNTER — Other Ambulatory Visit: Payer: PPO

## 2016-12-09 ENCOUNTER — Other Ambulatory Visit: Payer: Self-pay | Admitting: Infectious Disease

## 2016-12-10 NOTE — Telephone Encounter (Signed)
Both CTs have been approved and scheduled for 12/11/16.

## 2016-12-11 ENCOUNTER — Ambulatory Visit
Admission: RE | Admit: 2016-12-11 | Discharge: 2016-12-11 | Disposition: A | Discharge status: Home / Self Care | Payer: PPO | Source: Ambulatory Visit | Attending: Infectious Disease | Admitting: Infectious Disease

## 2016-12-11 DIAGNOSIS — J439 Emphysema, unspecified: Secondary | ICD-10-CM | POA: Diagnosis not present

## 2016-12-11 DIAGNOSIS — Z227 Latent tuberculosis: Secondary | ICD-10-CM

## 2016-12-11 DIAGNOSIS — R634 Abnormal weight loss: Secondary | ICD-10-CM | POA: Diagnosis not present

## 2016-12-11 MED ORDER — IOPAMIDOL (ISOVUE-300) INJECTION 61%
100.0000 mL | Freq: Once | INTRAVENOUS | Status: AC | PRN
  Administered 2016-12-11: 100 mL via INTRAVENOUS

## 2016-12-15 NOTE — Telephone Encounter (Signed)
I reviewed the CT scans. I dont think the LN mentioned are not consequential and dont have anything to do with his + latent TB

## 2016-12-25 DIAGNOSIS — M4316 Spondylolisthesis, lumbar region: Secondary | ICD-10-CM | POA: Diagnosis not present

## 2016-12-25 DIAGNOSIS — M48061 Spinal stenosis, lumbar region without neurogenic claudication: Secondary | ICD-10-CM | POA: Diagnosis not present

## 2016-12-25 DIAGNOSIS — M5021 Other cervical disc displacement,  high cervical region: Secondary | ICD-10-CM | POA: Diagnosis not present

## 2016-12-25 DIAGNOSIS — M4312 Spondylolisthesis, cervical region: Secondary | ICD-10-CM | POA: Diagnosis not present

## 2016-12-25 DIAGNOSIS — M47816 Spondylosis without myelopathy or radiculopathy, lumbar region: Secondary | ICD-10-CM | POA: Diagnosis not present

## 2016-12-25 DIAGNOSIS — G629 Polyneuropathy, unspecified: Secondary | ICD-10-CM | POA: Diagnosis not present

## 2016-12-25 DIAGNOSIS — G63 Polyneuropathy in diseases classified elsewhere: Secondary | ICD-10-CM | POA: Diagnosis not present

## 2016-12-25 DIAGNOSIS — G894 Chronic pain syndrome: Secondary | ICD-10-CM | POA: Diagnosis not present

## 2016-12-25 DIAGNOSIS — M419 Scoliosis, unspecified: Secondary | ICD-10-CM | POA: Diagnosis not present

## 2016-12-25 DIAGNOSIS — M5126 Other intervertebral disc displacement, lumbar region: Secondary | ICD-10-CM | POA: Diagnosis not present

## 2016-12-31 DIAGNOSIS — G8929 Other chronic pain: Secondary | ICD-10-CM | POA: Diagnosis not present

## 2016-12-31 DIAGNOSIS — R5382 Chronic fatigue, unspecified: Secondary | ICD-10-CM | POA: Diagnosis not present

## 2016-12-31 DIAGNOSIS — G63 Polyneuropathy in diseases classified elsewhere: Secondary | ICD-10-CM | POA: Diagnosis not present

## 2017-01-01 DIAGNOSIS — G8929 Other chronic pain: Secondary | ICD-10-CM | POA: Diagnosis not present

## 2017-01-01 DIAGNOSIS — R5382 Chronic fatigue, unspecified: Secondary | ICD-10-CM | POA: Diagnosis not present

## 2017-01-01 DIAGNOSIS — G63 Polyneuropathy in diseases classified elsewhere: Secondary | ICD-10-CM | POA: Diagnosis not present

## 2017-01-21 DIAGNOSIS — G43B0 Ophthalmoplegic migraine, not intractable: Secondary | ICD-10-CM | POA: Diagnosis not present

## 2017-01-21 DIAGNOSIS — G894 Chronic pain syndrome: Secondary | ICD-10-CM | POA: Diagnosis not present

## 2017-01-21 DIAGNOSIS — R7982 Elevated C-reactive protein (CRP): Secondary | ICD-10-CM | POA: Diagnosis not present

## 2017-01-21 DIAGNOSIS — S060X0A Concussion without loss of consciousness, initial encounter: Secondary | ICD-10-CM | POA: Diagnosis not present

## 2017-02-11 DIAGNOSIS — R110 Nausea: Secondary | ICD-10-CM | POA: Diagnosis not present

## 2017-02-11 DIAGNOSIS — G932 Benign intracranial hypertension: Secondary | ICD-10-CM | POA: Diagnosis not present

## 2017-02-11 DIAGNOSIS — F411 Generalized anxiety disorder: Secondary | ICD-10-CM | POA: Diagnosis not present

## 2017-02-11 DIAGNOSIS — H93A3 Pulsatile tinnitus, bilateral: Secondary | ICD-10-CM | POA: Diagnosis not present

## 2017-02-22 ENCOUNTER — Ambulatory Visit: Payer: PPO | Admitting: Infectious Disease

## 2017-03-04 DIAGNOSIS — H5713 Ocular pain, bilateral: Secondary | ICD-10-CM | POA: Diagnosis not present

## 2017-03-04 DIAGNOSIS — J3489 Other specified disorders of nose and nasal sinuses: Secondary | ICD-10-CM | POA: Diagnosis not present

## 2017-03-04 DIAGNOSIS — M542 Cervicalgia: Secondary | ICD-10-CM | POA: Diagnosis not present

## 2017-03-04 DIAGNOSIS — R634 Abnormal weight loss: Secondary | ICD-10-CM | POA: Diagnosis not present

## 2017-03-16 DIAGNOSIS — N390 Urinary tract infection, site not specified: Secondary | ICD-10-CM | POA: Diagnosis not present

## 2017-03-16 DIAGNOSIS — M542 Cervicalgia: Secondary | ICD-10-CM | POA: Diagnosis not present

## 2017-03-16 DIAGNOSIS — M50321 Other cervical disc degeneration at C4-C5 level: Secondary | ICD-10-CM | POA: Diagnosis not present

## 2017-03-16 DIAGNOSIS — M47892 Other spondylosis, cervical region: Secondary | ICD-10-CM | POA: Diagnosis not present

## 2017-03-16 DIAGNOSIS — G894 Chronic pain syndrome: Secondary | ICD-10-CM | POA: Diagnosis not present

## 2017-03-16 DIAGNOSIS — F17210 Nicotine dependence, cigarettes, uncomplicated: Secondary | ICD-10-CM | POA: Diagnosis not present

## 2017-03-16 DIAGNOSIS — R300 Dysuria: Secondary | ICD-10-CM | POA: Diagnosis not present

## 2017-03-25 DIAGNOSIS — M5440 Lumbago with sciatica, unspecified side: Secondary | ICD-10-CM | POA: Diagnosis not present

## 2017-03-25 DIAGNOSIS — M542 Cervicalgia: Secondary | ICD-10-CM | POA: Diagnosis not present

## 2017-03-25 DIAGNOSIS — M5000 Cervical disc disorder with myelopathy, unspecified cervical region: Secondary | ICD-10-CM | POA: Diagnosis not present

## 2017-03-25 DIAGNOSIS — M47816 Spondylosis without myelopathy or radiculopathy, lumbar region: Secondary | ICD-10-CM | POA: Diagnosis not present

## 2017-03-29 DIAGNOSIS — G932 Benign intracranial hypertension: Secondary | ICD-10-CM | POA: Diagnosis not present

## 2017-03-29 DIAGNOSIS — R112 Nausea with vomiting, unspecified: Secondary | ICD-10-CM | POA: Diagnosis not present

## 2017-03-29 DIAGNOSIS — F4323 Adjustment disorder with mixed anxiety and depressed mood: Secondary | ICD-10-CM | POA: Diagnosis not present

## 2017-03-31 ENCOUNTER — Other Ambulatory Visit: Payer: Self-pay | Admitting: Neurosurgery

## 2017-03-31 DIAGNOSIS — M5440 Lumbago with sciatica, unspecified side: Secondary | ICD-10-CM

## 2017-03-31 DIAGNOSIS — M5000 Cervical disc disorder with myelopathy, unspecified cervical region: Secondary | ICD-10-CM

## 2017-04-07 DIAGNOSIS — R300 Dysuria: Secondary | ICD-10-CM | POA: Diagnosis not present

## 2017-04-07 DIAGNOSIS — G8929 Other chronic pain: Secondary | ICD-10-CM | POA: Diagnosis not present

## 2017-04-07 DIAGNOSIS — G63 Polyneuropathy in diseases classified elsewhere: Secondary | ICD-10-CM | POA: Diagnosis not present

## 2017-04-07 DIAGNOSIS — K429 Umbilical hernia without obstruction or gangrene: Secondary | ICD-10-CM | POA: Diagnosis not present

## 2017-04-09 ENCOUNTER — Ambulatory Visit: Payer: PPO

## 2017-05-03 DIAGNOSIS — G894 Chronic pain syndrome: Secondary | ICD-10-CM | POA: Diagnosis not present

## 2017-05-03 DIAGNOSIS — R5382 Chronic fatigue, unspecified: Secondary | ICD-10-CM | POA: Diagnosis not present

## 2017-05-03 DIAGNOSIS — S060X0A Concussion without loss of consciousness, initial encounter: Secondary | ICD-10-CM | POA: Diagnosis not present

## 2017-05-03 DIAGNOSIS — G43B0 Ophthalmoplegic migraine, not intractable: Secondary | ICD-10-CM | POA: Diagnosis not present

## 2017-05-05 DIAGNOSIS — Z79899 Other long term (current) drug therapy: Secondary | ICD-10-CM | POA: Diagnosis not present

## 2017-05-05 DIAGNOSIS — M25569 Pain in unspecified knee: Secondary | ICD-10-CM | POA: Diagnosis not present

## 2017-05-05 DIAGNOSIS — M79673 Pain in unspecified foot: Secondary | ICD-10-CM | POA: Diagnosis not present

## 2017-05-05 DIAGNOSIS — M542 Cervicalgia: Secondary | ICD-10-CM | POA: Diagnosis not present

## 2017-05-12 DIAGNOSIS — G5623 Lesion of ulnar nerve, bilateral upper limbs: Secondary | ICD-10-CM | POA: Diagnosis not present

## 2017-05-12 DIAGNOSIS — M542 Cervicalgia: Secondary | ICD-10-CM | POA: Diagnosis not present

## 2017-05-12 DIAGNOSIS — G8929 Other chronic pain: Secondary | ICD-10-CM | POA: Diagnosis not present

## 2017-05-12 DIAGNOSIS — M47892 Other spondylosis, cervical region: Secondary | ICD-10-CM | POA: Diagnosis not present

## 2017-05-14 DIAGNOSIS — M4312 Spondylolisthesis, cervical region: Secondary | ICD-10-CM | POA: Diagnosis not present

## 2017-05-14 DIAGNOSIS — G894 Chronic pain syndrome: Secondary | ICD-10-CM | POA: Diagnosis not present

## 2017-05-14 DIAGNOSIS — M4316 Spondylolisthesis, lumbar region: Secondary | ICD-10-CM | POA: Diagnosis not present

## 2017-05-14 DIAGNOSIS — R634 Abnormal weight loss: Secondary | ICD-10-CM | POA: Diagnosis not present

## 2017-05-18 DIAGNOSIS — R5382 Chronic fatigue, unspecified: Secondary | ICD-10-CM | POA: Diagnosis not present

## 2017-05-18 DIAGNOSIS — M4316 Spondylolisthesis, lumbar region: Secondary | ICD-10-CM | POA: Diagnosis not present

## 2017-05-18 DIAGNOSIS — G894 Chronic pain syndrome: Secondary | ICD-10-CM | POA: Diagnosis not present

## 2017-05-18 DIAGNOSIS — D510 Vitamin B12 deficiency anemia due to intrinsic factor deficiency: Secondary | ICD-10-CM | POA: Diagnosis not present

## 2017-05-18 DIAGNOSIS — M4312 Spondylolisthesis, cervical region: Secondary | ICD-10-CM | POA: Diagnosis not present

## 2017-05-18 DIAGNOSIS — F3289 Other specified depressive episodes: Secondary | ICD-10-CM | POA: Diagnosis not present

## 2017-05-18 DIAGNOSIS — I2699 Other pulmonary embolism without acute cor pulmonale: Secondary | ICD-10-CM | POA: Diagnosis not present

## 2017-05-18 DIAGNOSIS — Z981 Arthrodesis status: Secondary | ICD-10-CM | POA: Diagnosis not present

## 2017-05-18 DIAGNOSIS — G8929 Other chronic pain: Secondary | ICD-10-CM | POA: Diagnosis not present

## 2017-05-18 DIAGNOSIS — G63 Polyneuropathy in diseases classified elsewhere: Secondary | ICD-10-CM | POA: Diagnosis not present

## 2017-05-24 DIAGNOSIS — H6122 Impacted cerumen, left ear: Secondary | ICD-10-CM | POA: Diagnosis not present

## 2017-05-24 DIAGNOSIS — H903 Sensorineural hearing loss, bilateral: Secondary | ICD-10-CM | POA: Diagnosis not present

## 2017-05-24 DIAGNOSIS — H9319 Tinnitus, unspecified ear: Secondary | ICD-10-CM | POA: Diagnosis not present

## 2017-06-10 DIAGNOSIS — G894 Chronic pain syndrome: Secondary | ICD-10-CM | POA: Diagnosis not present

## 2017-06-10 DIAGNOSIS — N390 Urinary tract infection, site not specified: Secondary | ICD-10-CM | POA: Diagnosis not present

## 2017-06-10 DIAGNOSIS — N419 Inflammatory disease of prostate, unspecified: Secondary | ICD-10-CM | POA: Diagnosis not present

## 2017-06-10 DIAGNOSIS — M4316 Spondylolisthesis, lumbar region: Secondary | ICD-10-CM | POA: Diagnosis not present

## 2017-06-10 DIAGNOSIS — G63 Polyneuropathy in diseases classified elsewhere: Secondary | ICD-10-CM | POA: Diagnosis not present

## 2017-06-10 DIAGNOSIS — R5382 Chronic fatigue, unspecified: Secondary | ICD-10-CM | POA: Diagnosis not present

## 2017-06-22 DIAGNOSIS — M792 Neuralgia and neuritis, unspecified: Secondary | ICD-10-CM | POA: Diagnosis not present

## 2017-06-22 DIAGNOSIS — R110 Nausea: Secondary | ICD-10-CM | POA: Diagnosis not present

## 2017-07-05 DIAGNOSIS — G44009 Cluster headache syndrome, unspecified, not intractable: Secondary | ICD-10-CM | POA: Diagnosis not present

## 2017-07-05 DIAGNOSIS — G894 Chronic pain syndrome: Secondary | ICD-10-CM | POA: Diagnosis not present

## 2017-07-13 DIAGNOSIS — J0100 Acute maxillary sinusitis, unspecified: Secondary | ICD-10-CM | POA: Diagnosis not present

## 2017-07-13 DIAGNOSIS — S30860A Insect bite (nonvenomous) of lower back and pelvis, initial encounter: Secondary | ICD-10-CM | POA: Diagnosis not present

## 2017-07-13 DIAGNOSIS — W57XXXA Bitten or stung by nonvenomous insect and other nonvenomous arthropods, initial encounter: Secondary | ICD-10-CM | POA: Diagnosis not present

## 2017-07-13 DIAGNOSIS — Z125 Encounter for screening for malignant neoplasm of prostate: Secondary | ICD-10-CM | POA: Diagnosis not present

## 2017-07-13 DIAGNOSIS — H6993 Unspecified Eustachian tube disorder, bilateral: Secondary | ICD-10-CM | POA: Diagnosis not present

## 2017-07-21 DIAGNOSIS — K429 Umbilical hernia without obstruction or gangrene: Secondary | ICD-10-CM | POA: Diagnosis not present

## 2017-07-21 DIAGNOSIS — M549 Dorsalgia, unspecified: Secondary | ICD-10-CM | POA: Diagnosis not present

## 2017-07-21 DIAGNOSIS — G8929 Other chronic pain: Secondary | ICD-10-CM | POA: Diagnosis not present

## 2017-07-22 DIAGNOSIS — R6881 Early satiety: Secondary | ICD-10-CM | POA: Diagnosis not present

## 2017-07-22 DIAGNOSIS — R110 Nausea: Secondary | ICD-10-CM | POA: Diagnosis not present

## 2017-07-22 DIAGNOSIS — R140 Abdominal distension (gaseous): Secondary | ICD-10-CM | POA: Diagnosis not present

## 2017-07-23 ENCOUNTER — Other Ambulatory Visit: Payer: Self-pay | Admitting: Student

## 2017-07-23 DIAGNOSIS — R110 Nausea: Secondary | ICD-10-CM

## 2017-07-23 DIAGNOSIS — R6881 Early satiety: Secondary | ICD-10-CM

## 2017-07-23 DIAGNOSIS — R140 Abdominal distension (gaseous): Secondary | ICD-10-CM

## 2017-07-26 DIAGNOSIS — R51 Headache: Secondary | ICD-10-CM | POA: Diagnosis not present

## 2017-07-26 DIAGNOSIS — M792 Neuralgia and neuritis, unspecified: Secondary | ICD-10-CM | POA: Diagnosis not present

## 2017-07-26 DIAGNOSIS — M549 Dorsalgia, unspecified: Secondary | ICD-10-CM | POA: Diagnosis not present

## 2017-07-26 DIAGNOSIS — F1190 Opioid use, unspecified, uncomplicated: Secondary | ICD-10-CM | POA: Diagnosis not present

## 2017-07-26 DIAGNOSIS — G8929 Other chronic pain: Secondary | ICD-10-CM | POA: Diagnosis not present

## 2017-07-26 DIAGNOSIS — G894 Chronic pain syndrome: Secondary | ICD-10-CM | POA: Diagnosis not present

## 2017-07-30 ENCOUNTER — Ambulatory Visit
Admission: RE | Admit: 2017-07-30 | Discharge: 2017-07-30 | Disposition: A | Discharge status: Home / Self Care | Payer: PPO | Source: Ambulatory Visit | Attending: Neurosurgery | Admitting: Neurosurgery

## 2017-07-30 DIAGNOSIS — M5000 Cervical disc disorder with myelopathy, unspecified cervical region: Secondary | ICD-10-CM

## 2017-07-30 DIAGNOSIS — Z981 Arthrodesis status: Secondary | ICD-10-CM | POA: Insufficient documentation

## 2017-07-30 DIAGNOSIS — M5440 Lumbago with sciatica, unspecified side: Secondary | ICD-10-CM

## 2017-07-30 DIAGNOSIS — M4802 Spinal stenosis, cervical region: Secondary | ICD-10-CM | POA: Diagnosis not present

## 2017-07-30 DIAGNOSIS — G9589 Other specified diseases of spinal cord: Secondary | ICD-10-CM | POA: Diagnosis not present

## 2017-07-30 DIAGNOSIS — M4322 Fusion of spine, cervical region: Secondary | ICD-10-CM | POA: Diagnosis not present

## 2017-07-30 MED ORDER — IOPAMIDOL (ISOVUE-M 300) INJECTION 61%
15.0000 mL | Freq: Once | INTRAMUSCULAR | Status: AC | PRN
  Administered 2017-07-30: 10 mL via INTRATHECAL

## 2017-07-30 MED ORDER — OXYCODONE-ACETAMINOPHEN 5-325 MG PO TABS
2.0000 | ORAL_TABLET | Freq: Once | ORAL | Status: AC
  Administered 2017-07-30: 2 via ORAL
  Filled 2017-07-30: qty 2

## 2017-07-30 MED ORDER — OXYCODONE-ACETAMINOPHEN 5-325 MG PO TABS
ORAL_TABLET | ORAL | Status: AC
  Filled 2017-07-30: qty 2

## 2017-07-30 NOTE — Progress Notes (Signed)
Called to X-ray-patient experienced pain in back and headache during injection-diaphoretic.  Cold cloths to forehead and turned back onto back  Remained A&O x 3. To CT for myelogram-attached to monitor-VS stable.  Pt. States pain easing off and skin warm and dry.  To SR.

## 2017-07-30 NOTE — Progress Notes (Signed)
Patient ID: Gregory Cervantes, male   DOB: [birth year], 61 y.o.   MRN: [id number] Pt. Stable .VSS.Back stable.D/C instructions given.F/U with his M.D.

## 2017-08-02 DIAGNOSIS — R61 Generalized hyperhidrosis: Secondary | ICD-10-CM | POA: Diagnosis not present

## 2017-08-02 DIAGNOSIS — Z79891 Long term (current) use of opiate analgesic: Secondary | ICD-10-CM | POA: Diagnosis not present

## 2017-08-02 DIAGNOSIS — G8929 Other chronic pain: Secondary | ICD-10-CM | POA: Diagnosis not present

## 2017-08-02 DIAGNOSIS — Z114 Encounter for screening for human immunodeficiency virus [HIV]: Secondary | ICD-10-CM | POA: Diagnosis not present

## 2017-08-02 DIAGNOSIS — F17210 Nicotine dependence, cigarettes, uncomplicated: Secondary | ICD-10-CM | POA: Diagnosis not present

## 2017-08-02 DIAGNOSIS — M549 Dorsalgia, unspecified: Secondary | ICD-10-CM | POA: Diagnosis not present

## 2017-08-02 DIAGNOSIS — Z1159 Encounter for screening for other viral diseases: Secondary | ICD-10-CM | POA: Diagnosis not present

## 2017-08-06 DIAGNOSIS — K429 Umbilical hernia without obstruction or gangrene: Secondary | ICD-10-CM | POA: Diagnosis not present

## 2017-08-09 ENCOUNTER — Ambulatory Visit: Payer: PPO

## 2017-08-11 ENCOUNTER — Other Ambulatory Visit: Payer: Self-pay

## 2017-08-11 ENCOUNTER — Encounter
Admission: RE | Admit: 2017-08-11 | Discharge: 2017-08-11 | Disposition: A | Discharge status: Home / Self Care | Payer: PPO | Source: Ambulatory Visit | Attending: Surgery | Admitting: Surgery

## 2017-08-11 DIAGNOSIS — Z01810 Encounter for preprocedural cardiovascular examination: Secondary | ICD-10-CM | POA: Insufficient documentation

## 2017-08-11 DIAGNOSIS — M549 Dorsalgia, unspecified: Secondary | ICD-10-CM | POA: Diagnosis not present

## 2017-08-11 DIAGNOSIS — G8929 Other chronic pain: Secondary | ICD-10-CM | POA: Diagnosis not present

## 2017-08-11 DIAGNOSIS — Z01812 Encounter for preprocedural laboratory examination: Secondary | ICD-10-CM | POA: Diagnosis not present

## 2017-08-11 DIAGNOSIS — M5416 Radiculopathy, lumbar region: Secondary | ICD-10-CM | POA: Diagnosis not present

## 2017-08-11 DIAGNOSIS — D72820 Lymphocytosis (symptomatic): Secondary | ICD-10-CM | POA: Diagnosis not present

## 2017-08-11 DIAGNOSIS — G894 Chronic pain syndrome: Secondary | ICD-10-CM | POA: Diagnosis not present

## 2017-08-11 DIAGNOSIS — M4802 Spinal stenosis, cervical region: Secondary | ICD-10-CM | POA: Insufficient documentation

## 2017-08-11 HISTORY — DX: Personal history of pulmonary embolism: Z86.711

## 2017-08-11 LAB — SURGICAL PCR SCREEN
MRSA, PCR: NEGATIVE
STAPHYLOCOCCUS AUREUS: NEGATIVE

## 2017-08-11 NOTE — Patient Instructions (Signed)
Your procedure is scheduled on: 08/17/17 Tues Report to Same Day Surgery 2nd floor medical mall [facility] Entrance-take elevator on left to 2nd floor.  Check in with surgery information desk.) To find out your arrival time please call [phone] between 1PM - 3PM on 08/16/17 Mon  Remember: Instructions that are not followed completely may result in serious medical risk, up to and including death, or upon the discretion of your surgeon and anesthesiologist your surgery may need to be rescheduled.    _x___ 1. Do not eat food after midnight the night before your procedure. You may drink clear liquids up to 2 hours before you are scheduled to arrive at the hospital for your procedure.  Do not drink clear liquids within 2 hours of your scheduled arrival to the hospital.  Clear liquids include  --Water or Apple juice without pulp  --Clear carbohydrate beverage such as ClearFast or Gatorade  --Black Coffee or Clear Tea (No milk, no creamers, do not add anything to                  the coffee or Tea Type 1 and type 2 diabetics should only drink water.  No gum chewing or hard candies.     __x__ 2. No Alcohol for 24 hours before or after surgery.   __x__3. No Smoking for 24 prior to surgery.   ____  4. Bring all medications with you on the day of surgery if instructed.    __x__ 5. Notify your doctor if there is any change in your medical condition     (cold, fever, infections).     Do not wear jewelry, make-up, hairpins, clips or nail polish.  Do not wear lotions, powders, or perfumes. You may wear deodorant.  Do not shave 48 hours prior to surgery. Men may shave face and neck.  Do not bring valuables to the hospital.    [facility] is not responsible for any belongings or valuables.               Contacts, dentures or bridgework may not be worn into surgery.  Leave your suitcase in the car. After surgery it may be brought to your room.  For patients admitted to the hospital, discharge  time is determined by your                       treatment team.   Patients discharged the day of surgery will not be allowed to drive home.  You will need someone to drive you home and stay with you the night of your procedure.    Please read over the following fact sheets that you were given:   [facility] Preparing for Surgery and or MRSA Information   _x___ Take anti-hypertensive (unless it includes a diuretic), cardiac, seizure, asthma,     anti-reflux and psychiatric medicines. These include:  1. gabapentin (NEURONTIN) 400 MG capsule   2.HYDROcodone-acetaminophen (NORCO) 10-325 MG tablet  3.  4.  5.  6.  ____Fleets enema or Magnesium Citrate as directed.   _x___ Use CHG Soap or sage wipes as directed on instruction sheet   ____ Use inhalers on the day of surgery and bring to hospital day of surgery  ____ Stop Metformin and Janumet 2 days prior to surgery.    ____ Take 1/2 of usual insulin dose the night before surgery and none on the morning     surgery.   _x___ Follow recommendations from Cardiologist,  Pulmonologist or PCP regarding          stopping Aspirin, Coumadin, Plavix ,Eliquis, Effient, or Pradaxa, and Pletal.  X____Stop Anti-inflammatories such as Advil, Aleve, Ibuprofen, Motrin, Naproxen, Naprosyn, Goodies powders or aspirin products. OK to take Tylenol and                          Celebrex.   _x___ Stop supplements until after surgery.  But may continue Vitamin D, Vitamin B,       and multivitamin.   ____ Bring C-Pap to the hospital.

## 2017-08-12 DIAGNOSIS — M5416 Radiculopathy, lumbar region: Secondary | ICD-10-CM | POA: Diagnosis not present

## 2017-08-12 DIAGNOSIS — M5440 Lumbago with sciatica, unspecified side: Secondary | ICD-10-CM | POA: Diagnosis not present

## 2017-08-16 MED ORDER — CEFAZOLIN SODIUM-DEXTROSE 2-4 GM/100ML-% IV SOLN: 2.0000 g | Freq: Once | INTRAVENOUS | Status: DC

## 2017-08-17 ENCOUNTER — Encounter: Payer: Self-pay | Admitting: *Deleted

## 2017-08-17 ENCOUNTER — Encounter
Admission: RE | Disposition: A | Discharge status: Home / Self Care | Payer: Self-pay | Source: Ambulatory Visit | Attending: Surgery

## 2017-08-17 ENCOUNTER — Ambulatory Visit: Payer: PPO | Admitting: Anesthesiology

## 2017-08-17 ENCOUNTER — Ambulatory Visit
Admission: RE | Admit: 2017-08-17 | Discharge: 2017-08-17 | Disposition: A | Discharge status: Home / Self Care | Payer: PPO | Source: Ambulatory Visit | Attending: Surgery | Admitting: Surgery

## 2017-08-17 DIAGNOSIS — K429 Umbilical hernia without obstruction or gangrene: Secondary | ICD-10-CM | POA: Diagnosis not present

## 2017-08-17 DIAGNOSIS — F17200 Nicotine dependence, unspecified, uncomplicated: Secondary | ICD-10-CM | POA: Insufficient documentation

## 2017-08-17 HISTORY — PX: UMBILICAL HERNIA REPAIR: SHX196

## 2017-08-17 SURGERY — REPAIR, HERNIA, UMBILICAL, ADULT
Anesthesia: General

## 2017-08-17 MED ORDER — SEVOFLURANE IN SOLN
RESPIRATORY_TRACT | Status: AC
  Filled 2017-08-17: qty 250

## 2017-08-17 MED ORDER — ACETAMINOPHEN 10 MG/ML IV SOLN
INTRAVENOUS | Status: AC
  Filled 2017-08-17: qty 100

## 2017-08-17 MED ORDER — FAMOTIDINE 20 MG PO TABS
20.0000 mg | ORAL_TABLET | Freq: Once | ORAL | Status: AC
  Administered 2017-08-17: 20 mg via ORAL

## 2017-08-17 MED ORDER — LACTATED RINGERS IV SOLN
INTRAVENOUS | Status: DC
  Administered 2017-08-17 (×2): via INTRAVENOUS

## 2017-08-17 MED ORDER — SUGAMMADEX SODIUM 200 MG/2ML IV SOLN
INTRAVENOUS | Status: DC | PRN
  Administered 2017-08-17: 175 mg via INTRAVENOUS

## 2017-08-17 MED ORDER — OXYCODONE-ACETAMINOPHEN 5-325 MG PO TABS
ORAL_TABLET | ORAL | Status: AC
  Filled 2017-08-17: qty 1

## 2017-08-17 MED ORDER — PROPOFOL 10 MG/ML IV BOLUS
INTRAVENOUS | Status: DC | PRN
  Administered 2017-08-17: 130 mg via INTRAVENOUS

## 2017-08-17 MED ORDER — SUMATRIPTAN SUCCINATE 6 MG/0.5ML ~~LOC~~ SOLN
6.0000 mg | Freq: Once | SUBCUTANEOUS | Status: DC
  Filled 2017-08-17: qty 0.5

## 2017-08-17 MED ORDER — MIDAZOLAM HCL 2 MG/2ML IJ SOLN
INTRAMUSCULAR | Status: DC | PRN
  Administered 2017-08-17: 2 mg via INTRAVENOUS

## 2017-08-17 MED ORDER — FENTANYL CITRATE (PF) 100 MCG/2ML IJ SOLN
50.0000 ug | Freq: Once | INTRAMUSCULAR | Status: AC
  Administered 2017-08-17: 50 ug via INTRAVENOUS

## 2017-08-17 MED ORDER — FENTANYL CITRATE (PF) 100 MCG/2ML IJ SOLN
INTRAMUSCULAR | Status: AC
  Administered 2017-08-17: 50 ug via INTRAVENOUS
  Filled 2017-08-17: qty 2

## 2017-08-17 MED ORDER — LIDOCAINE HCL (CARDIAC) 20 MG/ML IV SOLN
INTRAVENOUS | Status: DC | PRN
  Administered 2017-08-17: 40 mg via INTRAVENOUS

## 2017-08-17 MED ORDER — PROPOFOL 10 MG/ML IV BOLUS
INTRAVENOUS | Status: AC
  Filled 2017-08-17: qty 20

## 2017-08-17 MED ORDER — FENTANYL CITRATE (PF) 100 MCG/2ML IJ SOLN
25.0000 ug | INTRAMUSCULAR | Status: DC | PRN
  Administered 2017-08-17 (×3): 50 ug via INTRAVENOUS

## 2017-08-17 MED ORDER — MIDAZOLAM HCL 2 MG/2ML IJ SOLN
INTRAMUSCULAR | Status: AC
  Filled 2017-08-17: qty 2

## 2017-08-17 MED ORDER — OXYCODONE-ACETAMINOPHEN 5-325 MG PO TABS: 1.0000 | ORAL_TABLET | ORAL | 0 refills | Status: DC | PRN

## 2017-08-17 MED ORDER — FENTANYL CITRATE (PF) 100 MCG/2ML IJ SOLN
INTRAMUSCULAR | Status: AC
  Filled 2017-08-17: qty 2

## 2017-08-17 MED ORDER — EPHEDRINE SULFATE 50 MG/ML IJ SOLN
INTRAMUSCULAR | Status: DC | PRN
  Administered 2017-08-17: 10 mg via INTRAVENOUS

## 2017-08-17 MED ORDER — SUGAMMADEX SODIUM 200 MG/2ML IV SOLN
INTRAVENOUS | Status: AC
  Filled 2017-08-17: qty 2

## 2017-08-17 MED ORDER — BUPIVACAINE-EPINEPHRINE 0.5% -1:200000 IJ SOLN
INTRAMUSCULAR | Status: DC | PRN
  Administered 2017-08-17: 8 mL

## 2017-08-17 MED ORDER — BUPIVACAINE-EPINEPHRINE (PF) 0.5% -1:200000 IJ SOLN
INTRAMUSCULAR | Status: AC
  Filled 2017-08-17: qty 30

## 2017-08-17 MED ORDER — OXYCODONE-ACETAMINOPHEN 5-325 MG PO TABS
1.0000 | ORAL_TABLET | ORAL | Status: DC | PRN
  Administered 2017-08-17: 1 via ORAL

## 2017-08-17 MED ORDER — OXYCODONE-ACETAMINOPHEN 5-325 MG PO TABS: 1.0000 | ORAL_TABLET | ORAL | Status: DC | PRN

## 2017-08-17 MED ORDER — ACETAMINOPHEN 10 MG/ML IV SOLN
INTRAVENOUS | Status: DC | PRN
  Administered 2017-08-17: 1000 mg via INTRAVENOUS

## 2017-08-17 MED ORDER — FENTANYL CITRATE (PF) 100 MCG/2ML IJ SOLN
INTRAMUSCULAR | Status: DC | PRN
  Administered 2017-08-17 (×2): 50 ug via INTRAVENOUS

## 2017-08-17 MED ORDER — CEFAZOLIN SODIUM-DEXTROSE 2-4 GM/100ML-% IV SOLN
INTRAVENOUS | Status: AC
  Filled 2017-08-17: qty 100

## 2017-08-17 MED ORDER — LIDOCAINE HCL (PF) 2 % IJ SOLN
INTRAMUSCULAR | Status: AC
  Filled 2017-08-17: qty 2

## 2017-08-17 MED ORDER — ROCURONIUM BROMIDE 100 MG/10ML IV SOLN
INTRAVENOUS | Status: DC | PRN
  Administered 2017-08-17: 40 mg via INTRAVENOUS

## 2017-08-17 MED ORDER — PROMETHAZINE HCL 25 MG/ML IJ SOLN: 6.2500 mg | INTRAMUSCULAR | Status: DC | PRN

## 2017-08-17 MED ORDER — ROCURONIUM BROMIDE 50 MG/5ML IV SOLN
INTRAVENOUS | Status: AC
  Filled 2017-08-17: qty 1

## 2017-08-17 MED ORDER — FAMOTIDINE 20 MG PO TABS
ORAL_TABLET | ORAL | Status: AC
  Administered 2017-08-17: 20 mg via ORAL
  Filled 2017-08-17: qty 1

## 2017-08-17 SURGICAL SUPPLY — 25 items
BLADE SURG 15 STRL LF DISP TIS (BLADE) ×1 IMPLANT
BLADE SURG 15 STRL SS (BLADE) ×1
CANISTER SUCT 1200ML W/VALVE (MISCELLANEOUS) ×2 IMPLANT
CHLORAPREP W/TINT 26ML (MISCELLANEOUS) ×2 IMPLANT
DERMABOND ADVANCED (GAUZE/BANDAGES/DRESSINGS) ×1
DERMABOND ADVANCED .7 DNX12 (GAUZE/BANDAGES/DRESSINGS) ×1 IMPLANT
DRAPE LAPAROTOMY 77X122 PED (DRAPES) ×2 IMPLANT
ELECT REM PT RETURN 9FT ADLT (ELECTROSURGICAL) ×2
ELECTRODE REM PT RTRN 9FT ADLT (ELECTROSURGICAL) ×1 IMPLANT
GLOVE BIO SURGEON STRL SZ7.5 (GLOVE) ×6 IMPLANT
GOWN STRL REUS W/ TWL LRG LVL3 (GOWN DISPOSABLE) ×2 IMPLANT
GOWN STRL REUS W/TWL LRG LVL3 (GOWN DISPOSABLE) ×2
KIT RM TURNOVER STRD PROC AR (KITS) ×2 IMPLANT
LABEL OR SOLS (LABEL) ×2 IMPLANT
MESH SYNTHETIC 4X6 SOFT BARD (Mesh General) ×1 IMPLANT
MESH SYNTHETIC SOFT BARD 4X6 (Mesh General) ×1 IMPLANT
NEEDLE HYPO 25X1 1.5 SAFETY (NEEDLE) ×2 IMPLANT
NS IRRIG 500ML POUR BTL (IV SOLUTION) ×2 IMPLANT
PACK BASIN MINOR ARMC (MISCELLANEOUS) ×2 IMPLANT
SUT CHROMIC 3 0 SH 27 (SUTURE) ×2 IMPLANT
SUT CHROMIC 4 0 RB 1X27 (SUTURE) ×2 IMPLANT
SUT MNCRL+ 5-0 UNDYED PC-3 (SUTURE) ×1 IMPLANT
SUT MONOCRYL 5-0 (SUTURE) ×1
SUT SURGILON 0 30 BLK (SUTURE) ×2 IMPLANT
SYRINGE 10CC LL (SYRINGE) ×2 IMPLANT

## 2017-08-17 NOTE — Discharge Instructions (Addendum)
Take Tylenol or oxycodone with acetaminophen if needed for pain.  Should not drive or do anything dangerous when taking oxycodone.  May shower and blot dry.  Avoid straining and heavy lifting.  AMBULATORY SURGERY  DISCHARGE INSTRUCTIONS   1) The drugs that you were given will stay in your system until tomorrow so for the next 24 hours you should not:  A) Drive an automobile B) Make any legal decisions C) Drink any alcoholic beverage   2) You may resume regular meals tomorrow.  Today it is better to start with liquids and gradually work up to solid foods.  You may eat anything you prefer, but it is better to start with liquids, then soup and crackers, and gradually work up to solid foods.   3) Please notify your doctor immediately if you have any unusual bleeding, trouble breathing, redness and pain at the surgery site, drainage, fever, or pain not relieved by medication.   Additional Instructions:Drink plenty of fluids today.  Take stool softeners along with any pain medication. Have some solid food in your stomach prior to taking this medication.  You may use Tylenol or Ibuprofen in between doses of Norco to enhance their effect or take just those medicines if you do not need a narcotic.  Support abdomen with a firm support, such as a pillow. Use this when laughing, coughing, moving, sitting.     Please contact your physician with any problems or Same Day Surgery at [phone], Monday through Friday 6 am to 4 pm, or [place] at [facility] number at [phone].

## 2017-08-17 NOTE — Transfer of Care (Signed)
Immediate Anesthesia Transfer of Care Note  Patient: Gregory Cervantes  Procedure(s) Performed: Procedure(s): HERNIA REPAIR UMBILICAL ADULT (N/A)  Patient Location: PACU  Anesthesia Type:General  Level of Consciousness: awake  Airway & Oxygen Therapy: Patient Spontanous Breathing and Patient connected to face mask oxygen  Post-op Assessment: Report given to RN and Post -op Vital signs reviewed and stable  Post vital signs: Reviewed and stable  Last Vitals:  Vitals:   08/17/17 0610  BP: 111/70  Pulse: 95  Resp: 16  Temp: (!) 36.1 C  SpO2: 99%    Last Pain:  Vitals:   08/17/17 0610  TempSrc: Oral  PainSc: 8       Patients Stated Pain Goal: 2 (86/77/37 3668)  Complications: No apparent anesthesia complications

## 2017-08-17 NOTE — Anesthesia Post-op Follow-up Note (Signed)
Anesthesia QCDR form completed.        

## 2017-08-17 NOTE — H&P (Signed)
  He comes today for umbilical hernia repair.  He reports no change in condition since office exam.  Labs noted.  Discussed plan for umbilical hernia repair.

## 2017-08-17 NOTE — Op Note (Signed)
OPERATIVE REPORT  PREOPERATIVE  DIAGNOSIS: Umbilical hernia  POSTOPERATIVE DIAGNOSIS: Umbilical hernia  PROCEDURE: Umbilical hernia repair  ANESTHESIA: General  SURGEON: Rochel Brome M.D.  INDICATIONS:  He has had gradual enlargement and moderate discomfort with umbilical hernia. Surgery was recommended for definitive treatment.  The patient was placed on the operating table in the supine position under general anesthesia. The abdomen was prepared with ChloraPrep and draped in a sterile manner. A transversely orientedsupraumbilical curvilinear incision was made and carried down through subcutaneous tissues.  Bard soft mesh was cut to create an oval shape of 1.5 x 2.5 cm. It was placed into the properitoneal plane oriented transversely and sutured to the overlying fascia with through and through 0 Surgilon sutures.  The fascial defect was closed with a transversely oriented suture line of interrupted 0 Surgilon figure-of-eight sutures incorporating each suture into the mesh. The skin of the umbilicus was sutured to the subcutaneous tissues with 3-0 chromic suture. The subcutaneous tissues were infiltrated with half percent Sensorcaine with epinephrine. The skin was closed with running 5-0 Monocryl subcuticular suture and Dermabond.  The patient appeared to be in satisfactory condition and was prepared for transfer to the recovery room  Marquez.D.

## 2017-08-17 NOTE — OR Nursing (Signed)
Patient anxious to leave. Pacing the floor to be discharged. Dr. Tamala Julian notified and said it was okay to release patient.

## 2017-08-17 NOTE — Anesthesia Preprocedure Evaluation (Signed)
Anesthesia Evaluation  Patient identified by MRN, date of birth, ID band Patient awake    Reviewed: Allergy & Precautions, H&P , NPO status , Patient's Chart, lab work & pertinent test results, reviewed documented beta blocker date and time   History of Anesthesia Complications (+) history of anesthetic complications (cluster headaches)  Airway Mallampati: II  TM Distance: >3 FB Neck ROM: full    Dental  (+) Chipped, Missing, Dental Advidsory Given   Pulmonary neg shortness of breath, neg sleep apnea, neg COPD, neg recent URI, Current Smoker,           Cardiovascular Exercise Tolerance: Good negative cardio ROS       Neuro/Psych  Headaches (cluster headaches), neg Seizures PSYCHIATRIC DISORDERS (Depression)    GI/Hepatic negative GI ROS, Neg liver ROS,   Endo/Other  negative endocrine ROS  Renal/GU negative Renal ROS  negative genitourinary   Musculoskeletal   Abdominal   Peds  Hematology negative hematology ROS (+)   Anesthesia Other Findings Past Medical History: No date: Arthritis No date: BPH (benign prostatic hyperplasia) No date: Chronic back pain 11/18/2016: Early satiety No date: Headache No date: History of pulmonary embolus (PE) No date: Insomnia 11/18/2016: Loss of weight 08/19/2016: Night sweat No date: Peripheral neuropathy 08/19/2016: Smoker 08/19/2016: TB lung, latent 08/19/2016: Weight gain   Reproductive/Obstetrics negative OB ROS                             Anesthesia Physical Anesthesia Plan  ASA: II  Anesthesia Plan: General   Post-op Pain Management:    Induction: Intravenous  PONV Risk Score and Plan: 1 and Ondansetron and Dexamethasone  Airway Management Planned: Oral ETT  Additional Equipment:   Intra-op Plan:   Post-operative Plan: Extubation in OR  Informed Consent: I have reviewed the patients History and Physical, chart, labs and discussed the  procedure including the risks, benefits and alternatives for the proposed anesthesia with the patient or authorized representative who has indicated his/her understanding and acceptance.   Dental Advisory Given  Plan Discussed with: Anesthesiologist, CRNA and Surgeon  Anesthesia Plan Comments:         Anesthesia Quick Evaluation

## 2017-08-17 NOTE — Anesthesia Procedure Notes (Addendum)
Procedure Name: Intubation Date/Time: 08/17/2017 7:42 AM Performed by: Allean Found Pre-anesthesia Checklist: Patient identified, Emergency Drugs available, Suction available, Patient being monitored and Timeout performed Patient Re-evaluated:Patient Re-evaluated prior to induction Oxygen Delivery Method: Circle system utilized Preoxygenation: Pre-oxygenation with 100% oxygen Induction Type: IV induction Ventilation: Mask ventilation without difficulty Laryngoscope Size: Mac and 3 Grade View: Grade I Tube type: Oral Tube size: 7.5 mm Number of attempts: 1 Airway Equipment and Method: Stylet Placement Confirmation: ETT inserted through vocal cords under direct vision,  positive ETCO2 and breath sounds checked- equal and bilateral Secured at: 22 cm Tube secured with: Tape Dental Injury: Teeth and Oropharynx as per pre-operative assessment

## 2017-08-17 NOTE — Anesthesia Postprocedure Evaluation (Signed)
Anesthesia Post Note  Patient: Gregory Cervantes  Procedure(s) Performed: Procedure(s) (LRB): HERNIA REPAIR UMBILICAL ADULT (N/A)  Patient location during evaluation: PACU Anesthesia Type: General Level of consciousness: awake and alert Pain management: pain level controlled Vital Signs Assessment: post-procedure vital signs reviewed and stable Respiratory status: spontaneous breathing, nonlabored ventilation, respiratory function stable and patient connected to nasal cannula oxygen Cardiovascular status: blood pressure returned to baseline and stable Postop Assessment: no signs of nausea or vomiting Anesthetic complications: no     Last Vitals:  Vitals:   08/17/17 1001 08/17/17 1036  BP:  100/70  Pulse: 68 72  Resp: 15 16  Temp: (!) 36.3 C   SpO2: 96% 98%    Last Pain:  Vitals:   08/17/17 1036  TempSrc:   PainSc: 4                  Martha Clan

## 2017-08-18 ENCOUNTER — Encounter: Payer: Self-pay | Admitting: Surgery

## 2017-08-20 ENCOUNTER — Encounter: Payer: Self-pay | Admitting: Surgery

## 2017-08-20 ENCOUNTER — Inpatient Hospital Stay: Payer: PPO | Attending: Oncology | Admitting: Oncology

## 2017-08-20 ENCOUNTER — Inpatient Hospital Stay: Payer: PPO

## 2017-08-20 VITALS — BP 113/73 | HR 97 | Temp 97.9°F | Resp 20 | Wt 191.2 lb

## 2017-08-20 DIAGNOSIS — M549 Dorsalgia, unspecified: Secondary | ICD-10-CM | POA: Diagnosis not present

## 2017-08-20 DIAGNOSIS — N400 Enlarged prostate without lower urinary tract symptoms: Secondary | ICD-10-CM | POA: Insufficient documentation

## 2017-08-20 DIAGNOSIS — F17210 Nicotine dependence, cigarettes, uncomplicated: Secondary | ICD-10-CM | POA: Insufficient documentation

## 2017-08-20 DIAGNOSIS — R7611 Nonspecific reaction to tuberculin skin test without active tuberculosis: Secondary | ICD-10-CM | POA: Diagnosis not present

## 2017-08-20 DIAGNOSIS — R700 Elevated erythrocyte sedimentation rate: Secondary | ICD-10-CM | POA: Insufficient documentation

## 2017-08-20 DIAGNOSIS — M542 Cervicalgia: Secondary | ICD-10-CM | POA: Diagnosis not present

## 2017-08-20 DIAGNOSIS — R5383 Other fatigue: Secondary | ICD-10-CM

## 2017-08-20 DIAGNOSIS — N281 Cyst of kidney, acquired: Secondary | ICD-10-CM | POA: Insufficient documentation

## 2017-08-20 DIAGNOSIS — M546 Pain in thoracic spine: Secondary | ICD-10-CM | POA: Diagnosis not present

## 2017-08-20 DIAGNOSIS — M1990 Unspecified osteoarthritis, unspecified site: Secondary | ICD-10-CM | POA: Diagnosis not present

## 2017-08-20 DIAGNOSIS — J439 Emphysema, unspecified: Secondary | ICD-10-CM | POA: Insufficient documentation

## 2017-08-20 DIAGNOSIS — M5416 Radiculopathy, lumbar region: Secondary | ICD-10-CM | POA: Diagnosis not present

## 2017-08-20 DIAGNOSIS — R61 Generalized hyperhidrosis: Secondary | ICD-10-CM

## 2017-08-20 DIAGNOSIS — R5382 Chronic fatigue, unspecified: Secondary | ICD-10-CM | POA: Insufficient documentation

## 2017-08-20 DIAGNOSIS — M4802 Spinal stenosis, cervical region: Secondary | ICD-10-CM | POA: Diagnosis not present

## 2017-08-20 DIAGNOSIS — R634 Abnormal weight loss: Secondary | ICD-10-CM | POA: Diagnosis not present

## 2017-08-20 DIAGNOSIS — D72820 Lymphocytosis (symptomatic): Secondary | ICD-10-CM

## 2017-08-20 DIAGNOSIS — G894 Chronic pain syndrome: Secondary | ICD-10-CM | POA: Insufficient documentation

## 2017-08-20 DIAGNOSIS — R590 Localized enlarged lymph nodes: Secondary | ICD-10-CM | POA: Diagnosis not present

## 2017-08-20 DIAGNOSIS — Z86711 Personal history of pulmonary embolism: Secondary | ICD-10-CM | POA: Insufficient documentation

## 2017-08-20 DIAGNOSIS — Z7952 Long term (current) use of systemic steroids: Secondary | ICD-10-CM | POA: Insufficient documentation

## 2017-08-20 DIAGNOSIS — Z86718 Personal history of other venous thrombosis and embolism: Secondary | ICD-10-CM | POA: Insufficient documentation

## 2017-08-20 DIAGNOSIS — Z79899 Other long term (current) drug therapy: Secondary | ICD-10-CM | POA: Insufficient documentation

## 2017-08-20 LAB — COMPREHENSIVE METABOLIC PANEL
ALBUMIN: 3.8 g/dL (ref 3.5–5.0)
ALT: 26 U/L (ref 17–63)
ANION GAP: 8 (ref 5–15)
AST: 32 U/L (ref 15–41)
Alkaline Phosphatase: 39 U/L (ref 38–126)
BUN: 17 mg/dL (ref 6–20)
CO2: 26 mmol/L (ref 22–32)
Calcium: 8.9 mg/dL (ref 8.9–10.3)
Chloride: 100 mmol/L — ABNORMAL LOW (ref 101–111)
Creatinine, Ser: 0.81 mg/dL (ref 0.61–1.24)
GFR calc Af Amer: >60 mL/min (ref >60)
GLUCOSE: 109 mg/dL — AB (ref 65–99)
POTASSIUM: 4.5 mmol/L (ref 3.5–5.1)
Sodium: 134 mmol/L — ABNORMAL LOW (ref 135–145)
TOTAL PROTEIN: 6.8 g/dL (ref 6.5–8.1)
Total Bilirubin: 0.5 mg/dL (ref 0.3–1.2)

## 2017-08-20 LAB — CBC WITH DIFFERENTIAL/PLATELET
BASOS PCT: 1 %
Basophils Absolute: 0 10*3/uL (ref 0–0.1)
EOS PCT: 2 %
Eosinophils Absolute: 0.2 10*3/uL (ref 0–0.7)
HEMATOCRIT: 39.4 % — AB (ref 40.0–52.0)
Hemoglobin: 13.9 g/dL (ref 13.0–18.0)
Lymphocytes Relative: 30 %
Lymphs Abs: 2.5 10*3/uL (ref 1.0–3.6)
MCH: 32.7 pg (ref 26.0–34.0)
MCHC: 35.3 g/dL (ref 32.0–36.0)
MCV: 92.7 fL (ref 80.0–100.0)
MONO ABS: 0.6 10*3/uL (ref 0.2–1.0)
MONOS PCT: 7 %
NEUTROS ABS: 5.2 10*3/uL (ref 1.4–6.5)
Neutrophils Relative %: 60 %
PLATELETS: 217 10*3/uL (ref 150–440)
RBC: 4.25 MIL/uL — ABNORMAL LOW (ref 4.40–5.90)
RDW: 16.1 % — AB (ref 11.5–14.5)
WBC: 8.5 10*3/uL (ref 3.8–10.6)

## 2017-08-20 LAB — TSH: TSH: 4.017 u[IU]/mL (ref 0.350–4.500)

## 2017-08-20 LAB — LACTATE DEHYDROGENASE: LDH: 167 U/L (ref 98–192)

## 2017-08-20 NOTE — Progress Notes (Signed)
Patient here today for initial evaluation regarding abnormal labs. Patient reports fatigue and sweating, states this is worse at night.

## 2017-08-20 NOTE — Progress Notes (Signed)
Hematology/Oncology Consult note [facility] [phone] [phone]  Patient Care Team: Valera Castle, MD as PCP - General (Family Medicine) Amil Amen, MD as Referring Physician (Neurology)   Name of the patient: Gregory Cervantes  [id number]  02-04-1956    Reason for referral- /leukocytosis/ lymphocytosis   Referring physician- Dr. [name]  Date of visit: 08/20/17   History of presenting illness- 61 yo male who has PMH listed as below who was referred by [name] for evaluation of lymphocytosis. Patient reports having fatigue, drenching night sweat and some weight loss.  He had chronic back pain s/p previous laminectomy and discectomy, recently found to have lumbar radiculitis, cervical spinal stenosis and was taking steroids. He reports he finish his course of prednisone yesterday.  Also recently had umbilical hernia repair.   He has history of provoked DVT and PET after left knee surgery in [place] and he took warfarin for a few months and then stopped.    Review of systems- Review of Systems  Constitutional: Positive for weight loss.       Night sweat  HENT: Negative.   Eyes: Negative.   Respiratory: Negative.   Cardiovascular: Negative.   Gastrointestinal: Negative.   Genitourinary: Negative.   Musculoskeletal: Positive for back pain and neck pain.  Skin: Negative.   Neurological: Negative.   Endo/Heme/Allergies: Negative.   Psychiatric/Behavioral: Negative.     No Known Allergies  Patient Active Problem List   Diagnosis Date Noted  . Loss of weight 11/18/2016  . Early satiety 11/18/2016  . Nausea 09/04/2016  . TB lung, latent 09/19/202017  . Night sweats 09/19/202017  . Weight gain 09/19/202017  . Smoker 09/19/202017  . Neck pain, acute 08/04/2016  . Arthritis 05/18/2016  . Benign fibroma of prostate 05/18/2016  . Dental disease 05/18/2016  . ANA positive 03/31/2016  . Arthralgia of multiple joints  03/31/2016  . Elevated sed rate 03/31/2016  . Chronic fatigue 03/25/2016  . Contact with and exposure to tuberculosis 03/25/2016  . Numbness and tingling 03/05/2016  . Arthritis, degenerative 01/22/2016  . 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 Breast lump 04/15/2015  . Clinical depression 10/22/2014  . Major depressive disorder with single episode 10/22/2014  . Exomphalos 08/02/2014  . Avascular necrosis of bone of hip ([place]) 08/01/2014  . Idiopathic aseptic necrosis of bone ([place]) 08/01/2014  . Arthropathia 07/26/2014  . Benign prostatic hypertrophy without urinary obstruction 07/26/2014  . Disease of supporting structures of teeth 07/26/2014  . Chronic pain associated with significant psychosocial dysfunction 04/12/2014  . History of cervical spinal surgery 04/12/2014  . Failed back syndrome of lumbar spine 04/12/2014  . Degenerative arthritis of lumbar spine 04/12/2014  . L-S radiculopathy 04/12/2014  . Vascular disorder of lower extremity 04/09/2014  . History of anticoagulant therapy 04/09/2014  . Articular cartilage disease 03/28/2014  . Defect of articular cartilage 03/28/2014  . Accumulation of fluid in tissues 03/28/2014  . Edema, pitting 03/28/2014  . Current tear of lateral cartilage or meniscus of knee 03/28/2014  . Current tear knee, lateral meniscus 03/28/2014  . Gonalgia 02/14/2014  . Arthralgia of lower leg 02/14/2014  . Abnormal blood sugar 11/01/2013  . Disorder of eye movements 11/01/2013  . Candidiasis of esophagus ([place]) 11/01/2013  . Abnormal eye movements 11/01/2013  . Cephalalgia 11/01/2013  . Blood glucose elevated 11/01/2013  . Elevated WBC count 11/01/2013  . Long term current use of opiate analgesic 09/25/2013  . Other long term (current) drug therapy 09/25/2013  .  Back ache 10/08/2012  . Back pain, thoracic 10/08/2012  . 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 Testicular hypofunction 10/04/2012  . Back pain, chronic 09/29/2012  . Insomnia 09/29/2012    . 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 Idiopathic localized osteoarthropathy 11/18/2011  . Cervical post-laminectomy syndrome 09/01/2011  . Narrowing of intervertebral disc space 11/28/2003  . Low back pain 11/28/2003     Past Medical History:  Diagnosis Date  . Arthritis   . BPH (benign prostatic hyperplasia)   . Chronic back pain   . Early satiety 11/18/2016  . Headache   . History of pulmonary embolus (PE)   . Insomnia   . Loss of weight 11/18/2016  . Night sweat 08/19/2016  . Peripheral neuropathy   . Smoker 08/19/2016  . TB lung, latent 08/19/2016  . Weight gain 08/19/2016     Past Surgical History:  Procedure Laterality Date  . BACK SURGERY    . BRAIN SURGERY     x5- spinal fluid leaks  . CERVICAL FUSION    . DG SHOULDER RIGHT COMPLETE     x6  . HAND SURGERY Left   . HERNIA REPAIR    . KNEE ARTHROSCOPY Left    x3  . PROSTATE SURGERY    . UMBILICAL HERNIA REPAIR N/A 08/17/2017   Procedure: HERNIA REPAIR UMBILICAL ADULT;  Surgeon: Leonie Green, MD;  Location: [facility] ORS;  Service: General;  Laterality: N/A;  . WISDOM TOOTH EXTRACTION      Social History   Social History  . Marital status: Divorced    Spouse name: N/A  . Number of children: N/A  . Years of education: N/A   Occupational History  . Not on file.   Social History Main Topics  . Smoking status: Current Every Day Smoker    Packs/day: 0.50    Types: Cigarettes  . Smokeless tobacco: Never Used  . Alcohol use No     Comment: three times a year  . Drug use: No  . Sexual activity: No   Other Topics Concern  . Not on file   Social History Narrative  . No narrative on file     Family History  Problem Relation Age of Onset  . Cancer Mother        pancreatic and lung  . Cancer Father        leukemia  . Anxiety disorder Sister   . Anxiety disorder Sister   . Anxiety disorder Sister      Current Outpatient Prescriptions:  .  gabapentin (NEURONTIN) 400 MG capsule, Take 400 mg by mouth 5  (five) times daily as needed (for pain.). , Disp: , Rfl:  .  OxyCODONE HCl 30 MG TABA, Take 30 mg by mouth 3 (three) times daily., Disp: , Rfl:  .  predniSONE (DELTASONE) 5 MG tablet, 12:1 tapering dose. Take 12 tabs today, 11 tabs tomorrow, etc until gone, Disp: , Rfl:  .  SUMAtriptan (IMITREX) 6 MG/0.5ML SOLN injection, Inject 0.6 mg into the skin 2 (two) times daily as needed (*may repeat dose in 2 hrs--max 2/24hr*for migraine/cluster headaches.). , Disp: , Rfl:  .  Multiple Vitamin (MULTIVITAMIN WITH MINERALS) TABS tablet, Take 1 tablet by mouth daily., Disp: , Rfl:  .  naloxone (NARCAN) 0.4 MG/ML injection, Inject 0.4 mg into the muscle every 2 (two) hours as needed (for opoid overuse.). , Disp: , Rfl:  .  ondansetron (ZOFRAN) 4 MG tablet, Take 1 tablet (4 mg total) by mouth every 8 (eight) hours as  needed for nausea or vomiting. (Patient not taking: Reported on 08/20/2017), Disp: 30 tablet, Rfl: 6 .  oxyCODONE-acetaminophen (ROXICET) 5-325 MG tablet, Take 1-2 tablets by mouth every 4 (four) hours as needed for moderate pain or severe pain. (Patient not taking: Reported on 08/20/2017), Disp: 12 tablet, Rfl: 0 .  QUEtiapine (SEROQUEL) 100 MG tablet, TAKE 1 TABLET (100 MG TOTAL) BY MOUTH AT BEDTIME. (Patient not taking: Reported on 08/20/2017), Disp: 30 tablet, Rfl: 1   Physical exam:  Vitals:   08/20/17 1023  BP: 113/73  Pulse: 97  Resp: 20  Temp: 97.9 F (36.6 C)  TempSrc: Tympanic  Weight: 191 lb 3.2 oz (86.7 kg)   Physical Exam     CMP Latest Ref Rng & Units 08/20/2017  Glucose 65 - 99 mg/dL 109(H)  BUN 6 - 20 mg/dL 17  Creatinine 0.61 - 1.24 mg/dL 0.81  Sodium 135 - 145 mmol/L 134(L)  Potassium 3.5 - 5.1 mmol/L 4.5  Chloride 101 - 111 mmol/L 100(L)  CO2 22 - 32 mmol/L 26  Calcium 8.9 - 10.3 mg/dL 8.9  Total Protein 6.5 - 8.1 g/dL 6.8  Total Bilirubin 0.3 - 1.2 mg/dL 0.5  Alkaline Phos 38 - 126 U/L 39  AST 15 - 41 U/L 32  ALT 17 - 63 U/L 26   CBC Latest Ref Rng & Units  08/20/2017  WBC 3.8 - 10.6 K/uL 8.5  Hemoglobin 13.0 - 18.0 g/dL 13.9  Hematocrit 40.0 - 52.0 % 39.4(L)  Platelets 150 - 440 K/uL 217    No images are attached to the encounter.  Ct Cervical Spine W Contrast  Result Date: 07/30/2017 CLINICAL DATA:  Cervical disc disease with myelopathy. EXAM: CT MYELOGRAPHY CERVICAL SPINE TECHNIQUE: CT imaging of the cervical spine was performed after Isovue 300 M contrast administration. Multiplanar CT image reconstructions were also generated. COMPARISON:  MRI of the cervical spine 09/05/2016. FINDINGS: The intrathecal injection was performed by Dr. Register and dictated separately Alignment: Slight anterolisthesis is present at C4-5. There straightening of the normal cervical lordosis. AP alignment is otherwise anatomic. Vertebrae: Vertebral body heights are preserved. No focal lytic or blastic lesions are evident. Cord: The cervical spinal cord is outlined without focal compromise. Posterior Fossa and paraspinal tissues: Craniocervical junction is normal. Disc levels: C2-3 common asymmetric left-sided facet hypertrophy is present. There is uncovertebral spurring bilaterally. Severe left and moderate right foraminal stenosis is stable. C3-4: Facet hypertrophy is worse on the right. Severe right and moderate left foraminal stenosis is stable. C4-5: There is fusion across the disc space and facets. Mild left foraminal narrowing is stable. Go C5-6: Anterior and posterior fusion is solid. There is no residual recurrent stenosis. C6-7: Anterior and posterior fusion is solid. There is no residual stenosis. C7-T1: Uncovertebral and facet spurring is noted bilaterally. Moderate left and mild right foraminal stenosis is stable. IMPRESSION: 1. Similar appearance of severe left and moderate right foraminal stenosis at C2-3. 2. Similar appearance of severe right and moderate left foraminal stenosis at C3-4. 3. Solid fusion at C4-5 with mild left foraminal narrowing. 4. Solid fusion  at C5-6 and C6-7 without significant stenosis. 5. Moderate left and mild right foraminal narrowing at C7-T1. Electronically Signed   By: San Morelle M.D.   On: 07/30/2017 12:05   Dg Myelogram Cervical  Result Date: 07/30/2017 CLINICAL DATA:  Cervical disc disease with myelopathy. FLUOROSCOPY TIME:  dictate in minutes and seconds PROCEDURE: LUMBAR PUNCTURE FOR CERVICAL MYELOGRAM After discussing the risks and benefits of this  procedure including the possibility of headache and CSF leak informed consent was obtained. The back was sterilely prepped and draped. For local anesthesia with 1% lidocaine a 22 gauge spinal was advanced into the lumbar canal at the L3-L4 level under fluoroscopic guidance. 10 cc of Isovue-M 300 administered. Cervical myelogram obtained. Exam was very limited as the patient experienced a mild vagal reaction and had to stop the exam. Patient was monitored and remained in stable condition. Patient was transferred to CT for further studies. Following CT the patient was transferred to special is recovery in stable condition. I personally performed the lumbar puncture and administered the intrathecal contrast. I also personally supervised acquisition of the myelogram images. TECHNIQUE: Contiguous axial images were obtained through the Cervical spine after the intrathecal infusion of infusion. Coronal and sagittal reconstructions were obtained of the axial image sets. FINDINGS: CERVICAL MYELOGRAM FINDINGS: Successful injection for new cervical CT myelogram as above. Exam was limited as described above. Diffuse degenerative change. Reference is made to postmyelogram CT report. IMPRESSION: Successful cervical myelogram. Electronically Signed   By: Marcello Moores  Register   On: 07/30/2017 09:17    Assessment and plan- Patient is a 61 y.o. male presents for evaluation of lymphocytosis.   1. Lymphocytosis   2. Night sweats   3. Fatigue, unspecified type   4. Mediastinal lymphadenopathy    . Patient's recent lymphocytosis may be due to recent steroid use.  Repeat CBC w diff, CMP, TSH, peripheral blood flow cytometry, LDH, ANA.  Follow up in 10 days to discuss results.   Thank you for this kind referral and the opportunity to participate in the care of this patient   Dr. Earlie Server, MD, PhD [facility] at [facility] [address] Pager- [phone] 08/20/2017

## 2017-08-23 LAB — COMP PANEL: LEUKEMIA/LYMPHOMA

## 2017-08-23 LAB — ANTINUCLEAR ANTIBODIES, IFA: ANTINUCLEAR ANTIBODIES, IFA: NEGATIVE

## 2017-08-31 ENCOUNTER — Other Ambulatory Visit: Payer: Self-pay | Admitting: *Deleted

## 2017-08-31 ENCOUNTER — Inpatient Hospital Stay: Payer: PPO | Admitting: Oncology

## 2017-08-31 DIAGNOSIS — R768 Other specified abnormal immunological findings in serum: Secondary | ICD-10-CM

## 2017-08-31 NOTE — Addendum Note (Signed)
Addended by: Vernetta Honey on: 08/31/2017 01:59 PM   Modules accepted: Orders

## 2017-08-31 NOTE — Progress Notes (Signed)
Referral to Rheumatology ordered in error.

## 2017-09-01 DIAGNOSIS — D72820 Lymphocytosis (symptomatic): Secondary | ICD-10-CM | POA: Diagnosis not present

## 2017-09-01 DIAGNOSIS — D1724 Benign lipomatous neoplasm of skin and subcutaneous tissue of left leg: Secondary | ICD-10-CM | POA: Diagnosis not present

## 2017-09-01 DIAGNOSIS — G894 Chronic pain syndrome: Secondary | ICD-10-CM | POA: Diagnosis not present

## 2017-09-01 DIAGNOSIS — R61 Generalized hyperhidrosis: Secondary | ICD-10-CM | POA: Diagnosis not present

## 2017-09-01 DIAGNOSIS — R6881 Early satiety: Secondary | ICD-10-CM | POA: Diagnosis not present

## 2017-09-01 DIAGNOSIS — Z8639 Personal history of other endocrine, nutritional and metabolic disease: Secondary | ICD-10-CM | POA: Diagnosis not present

## 2017-09-01 DIAGNOSIS — R110 Nausea: Secondary | ICD-10-CM | POA: Diagnosis not present

## 2017-09-07 ENCOUNTER — Encounter: Payer: Self-pay | Admitting: Oncology

## 2017-09-07 ENCOUNTER — Inpatient Hospital Stay: Payer: PPO

## 2017-09-07 ENCOUNTER — Inpatient Hospital Stay (HOSPITAL_BASED_OUTPATIENT_CLINIC_OR_DEPARTMENT_OTHER): Payer: PPO | Admitting: Oncology

## 2017-09-07 VITALS — BP 111/71 | HR 79 | Temp 95.6°F | Resp 18 | Wt 186.6 lb

## 2017-09-07 DIAGNOSIS — M546 Pain in thoracic spine: Secondary | ICD-10-CM

## 2017-09-07 DIAGNOSIS — R590 Localized enlarged lymph nodes: Secondary | ICD-10-CM

## 2017-09-07 DIAGNOSIS — M1990 Unspecified osteoarthritis, unspecified site: Secondary | ICD-10-CM

## 2017-09-07 DIAGNOSIS — R700 Elevated erythrocyte sedimentation rate: Secondary | ICD-10-CM | POA: Diagnosis not present

## 2017-09-07 DIAGNOSIS — M5416 Radiculopathy, lumbar region: Secondary | ICD-10-CM

## 2017-09-07 DIAGNOSIS — R5382 Chronic fatigue, unspecified: Secondary | ICD-10-CM | POA: Diagnosis not present

## 2017-09-07 DIAGNOSIS — N400 Enlarged prostate without lower urinary tract symptoms: Secondary | ICD-10-CM

## 2017-09-07 DIAGNOSIS — R5383 Other fatigue: Secondary | ICD-10-CM

## 2017-09-07 DIAGNOSIS — R634 Abnormal weight loss: Secondary | ICD-10-CM

## 2017-09-07 DIAGNOSIS — G894 Chronic pain syndrome: Secondary | ICD-10-CM | POA: Diagnosis not present

## 2017-09-07 DIAGNOSIS — R7611 Nonspecific reaction to tuberculin skin test without active tuberculosis: Secondary | ICD-10-CM

## 2017-09-07 DIAGNOSIS — Z7952 Long term (current) use of systemic steroids: Secondary | ICD-10-CM

## 2017-09-07 DIAGNOSIS — Z79899 Other long term (current) drug therapy: Secondary | ICD-10-CM

## 2017-09-07 DIAGNOSIS — R61 Generalized hyperhidrosis: Secondary | ICD-10-CM

## 2017-09-07 DIAGNOSIS — D72820 Lymphocytosis (symptomatic): Secondary | ICD-10-CM

## 2017-09-07 DIAGNOSIS — J439 Emphysema, unspecified: Secondary | ICD-10-CM

## 2017-09-07 DIAGNOSIS — Z86711 Personal history of pulmonary embolism: Secondary | ICD-10-CM

## 2017-09-07 DIAGNOSIS — Z86718 Personal history of other venous thrombosis and embolism: Secondary | ICD-10-CM

## 2017-09-07 DIAGNOSIS — F17210 Nicotine dependence, cigarettes, uncomplicated: Secondary | ICD-10-CM

## 2017-09-07 DIAGNOSIS — M4802 Spinal stenosis, cervical region: Secondary | ICD-10-CM | POA: Diagnosis not present

## 2017-09-07 DIAGNOSIS — M542 Cervicalgia: Secondary | ICD-10-CM | POA: Diagnosis not present

## 2017-09-07 DIAGNOSIS — N281 Cyst of kidney, acquired: Secondary | ICD-10-CM

## 2017-09-07 LAB — C-REACTIVE PROTEIN: CRP: <0.8 mg/dL (ref <1.0)

## 2017-09-07 LAB — SEDIMENTATION RATE: Sed Rate: 22 mm/hr — ABNORMAL HIGH (ref 0–20)

## 2017-09-07 NOTE — Progress Notes (Signed)
Here for follow up. See follw up-pain c/o " my feet are burning up " per pt

## 2017-09-07 NOTE — Progress Notes (Signed)
Hematology/Oncology Follow Up Note [facility] [phone] [phone]  Patient Care Team: Valera Castle, MD as PCP - General (Family Medicine) Amil Amen, MD as Referring Physician (Neurology)   Name of the patient: Gregory Cervantes  [id number]  [birth year]    Reason for referral- /leukocytosis/ lymphocytosis   Referring physician- Dr. [name]  Date of visit: 09/07/17   History of presenting illness- 61 yo male who has PMH listed as below who was referred by [name] for evaluation of lymphocytosis. Patient reports having fatigue, drenching night sweat and some weight loss.  He had chronic back pain s/p previous laminectomy and discectomy, recently found to have lumbar radiculitis, cervical spinal stenosis and was taking steroids. He reports he finish his course of prednisone yesterday.  Also recently had umbilical hernia repair.  He has history of provoked DVT and PET after left knee surgery in [place] and he took warfarin for a few months and then stopped.   INTERVAL HISTORY Patient presents to discuss about the results. He continue to have profound fatigue and drenching night sweating which is chronic and has been having these symptoms for about 3 years.    Review of systems- Review of Systems  Constitutional: Positive for weight loss.       Night sweat  HENT: Negative.   Eyes: Negative.   Respiratory: Negative.   Cardiovascular: Negative.   Gastrointestinal: Negative.   Genitourinary: Negative.   Musculoskeletal: Positive for back pain and neck pain.  Skin: Negative.   Neurological: Negative.   Endo/Heme/Allergies: Negative.   Psychiatric/Behavioral: Negative.     No Known Allergies  Patient Active Problem List   Diagnosis Date Noted  . Loss of weight 11/18/2016  . Early satiety 11/18/2016  . Nausea 09/04/2016  . TB lung, latent 2020-02-3016  . Night sweats 2020-02-3016  . Weight gain 2020-02-3016  .  Smoker 2020-02-3016  . Neck pain, acute 08/04/2016  . Arthritis 05/18/2016  . Benign fibroma of prostate 05/18/2016  . Dental disease 05/18/2016  . ANA positive 03/31/2016  . Arthralgia of multiple joints 03/31/2016  . Elevated sed rate 03/31/2016  . Chronic fatigue 03/25/2016  . Contact with and exposure to tuberculosis 03/25/2016  . Numbness and tingling 03/05/2016  . Arthritis, degenerative 01/22/2016  . 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 Breast lump 04/15/2015  . Clinical depression 10/22/2014  . Major depressive disorder with single episode 10/22/2014  . Exomphalos 08/02/2014  . Avascular necrosis of bone of hip ([place]) 08/01/2014  . Idiopathic aseptic necrosis of bone ([place]) 08/01/2014  . Arthropathia 07/26/2014  . Benign prostatic hypertrophy without urinary obstruction 07/26/2014  . Disease of supporting structures of teeth 07/26/2014  . Chronic pain associated with significant psychosocial dysfunction 04/12/2014  . History of cervical spinal surgery 04/12/2014  . Failed back syndrome of lumbar spine 04/12/2014  . Degenerative arthritis of lumbar spine 04/12/2014  . L-S radiculopathy 04/12/2014  . Vascular disorder of lower extremity 04/09/2014  . History of anticoagulant therapy 04/09/2014  . Articular cartilage disease 03/28/2014  . Defect of articular cartilage 03/28/2014  . Accumulation of fluid in tissues 03/28/2014  . Edema, pitting 03/28/2014  . Current tear of lateral cartilage or meniscus of knee 03/28/2014  . Current tear knee, lateral meniscus 03/28/2014  . Gonalgia 02/14/2014  . Arthralgia of lower leg 02/14/2014  . Abnormal blood sugar 11/01/2013  . Disorder of eye movements 11/01/2013  . Candidiasis of esophagus ([place]) 11/01/2013  . Abnormal eye movements 11/01/2013  .  Cephalalgia 11/01/2013  . Blood glucose elevated 11/01/2013  . Elevated WBC count 11/01/2013  . Long term current use of opiate analgesic 09/25/2013  . Other long term (current)  drug therapy 09/25/2013  . Back ache 10/08/2012  . Back pain, thoracic 10/08/2012  . 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 Testicular hypofunction 10/04/2012  . Back pain, chronic 09/29/2012  . Insomnia 09/29/2012  . 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 Idiopathic localized osteoarthropathy 11/18/2011  . Cervical post-laminectomy syndrome 09/01/2011  . Narrowing of intervertebral disc space 11/28/2003  . Low back pain 11/28/2003     Past Medical History:  Diagnosis Date  . Arthritis   . BPH (benign prostatic hyperplasia)   . Chronic back pain   . Early satiety 11/18/2016  . Headache   . History of pulmonary embolus (PE)   . Insomnia   . Loss of weight 11/18/2016  . Night sweat 08/19/2016  . Peripheral neuropathy   . Smoker 08/19/2016  . TB lung, latent 08/19/2016  . Weight gain 08/19/2016     Past Surgical History:  Procedure Laterality Date  . BACK SURGERY    . BRAIN SURGERY     x5- spinal fluid leaks  . CERVICAL FUSION    . DG SHOULDER RIGHT COMPLETE     x6  . HAND SURGERY Left   . HERNIA REPAIR    . KNEE ARTHROSCOPY Left    x3  . PROSTATE SURGERY    . UMBILICAL HERNIA REPAIR N/A 08/17/2017   Procedure: HERNIA REPAIR UMBILICAL ADULT;  Surgeon: Leonie Green, MD;  Location: [facility] ORS;  Service: General;  Laterality: N/A;  . WISDOM TOOTH EXTRACTION      Social History   Social History  . Marital status: Divorced    Spouse name: N/A  . Number of children: N/A  . Years of education: N/A   Occupational History  . Not on file.   Social History Main Topics  . Smoking status: Current Every Day Smoker    Packs/day: 0.50    Types: Cigarettes  . Smokeless tobacco: Never Used  . Alcohol use No     Comment: three times a year  . Drug use: No  . Sexual activity: No   Other Topics Concern  . Not on file   Social History Narrative  . No narrative on file     Family History  Problem Relation Age of Onset  . Cancer Mother        pancreatic and lung  . Cancer Father         leukemia  . Anxiety disorder Sister   . Anxiety disorder Sister   . Anxiety disorder Sister      Current Outpatient Prescriptions:  .  gabapentin (NEURONTIN) 400 MG capsule, Take 400 mg by mouth 5 (five) times daily as needed (for pain.). , Disp: , Rfl:  .  Multiple Vitamin (MULTIVITAMIN WITH MINERALS) TABS tablet, Take 1 tablet by mouth daily., Disp: , Rfl:  .  ondansetron (ZOFRAN) 4 MG tablet, Take 1 tablet (4 mg total) by mouth every 8 (eight) hours as needed for nausea or vomiting., Disp: 30 tablet, Rfl: 6 .  QUEtiapine (SEROQUEL) 100 MG tablet, TAKE 1 TABLET (100 MG TOTAL) BY MOUTH AT BEDTIME., Disp: 30 tablet, Rfl: 1 .  naloxone (NARCAN) 0.4 MG/ML injection, Inject 0.4 mg into the muscle every 2 (two) hours as needed (for opoid overuse.). , Disp: , Rfl:  .  OxyCODONE HCl 30 MG TABA, Take 30 mg by mouth  3 (three) times daily., Disp: , Rfl:  .  oxyCODONE-acetaminophen (ROXICET) 5-325 MG tablet, Take 1-2 tablets by mouth every 4 (four) hours as needed for moderate pain or severe pain. (Patient not taking: Reported on 08/20/2017), Disp: 12 tablet, Rfl: 0 .  SUMAtriptan (IMITREX) 6 MG/0.5ML SOLN injection, Inject 0.6 mg into the skin 2 (two) times daily as needed (*may repeat dose in 2 hrs--max 2/24hr*for migraine/cluster headaches.). , Disp: , Rfl:    Physical exam:  Vitals:   09/07/17 1023  BP: 111/71  Pulse: 79  Resp: 18  Temp: (!) 95.6 F (35.3 C)  TempSrc: Tympanic  Weight: 186 lb 9.6 oz (84.6 kg)   Physical Exam     CMP Latest Ref Rng & Units 08/20/2017  Glucose 65 - 99 mg/dL 109(H)  BUN 6 - 20 mg/dL 17  Creatinine 0.61 - 1.24 mg/dL 0.81  Sodium 135 - 145 mmol/L 134(L)  Potassium 3.5 - 5.1 mmol/L 4.5  Chloride 101 - 111 mmol/L 100(L)  CO2 22 - 32 mmol/L 26  Calcium 8.9 - 10.3 mg/dL 8.9  Total Protein 6.5 - 8.1 g/dL 6.8  Total Bilirubin 0.3 - 1.2 mg/dL 0.5  Alkaline Phos 38 - 126 U/L 39  AST 15 - 41 U/L 32  ALT 17 - 63 U/L 26   CBC Latest Ref Rng & Units 08/20/2017   WBC 3.8 - 10.6 K/uL 8.5  Hemoglobin 13.0 - 18.0 g/dL 13.9  Hematocrit 40.0 - 52.0 % 39.4(L)  Platelets 150 - 440 K/uL 217   Images I have reviewed the images below personally:  CT chest w contrast 12/11/2016 IMPRESSION: 1. Significant emphysema, showing an interval progression. 2. Mediastinal and hilar adenopathy, consistent with known TB. 3. No discrete infiltrates or cavitary lesions. 4. Bilateral renal cysts. 5. Significant stool burden. 6. Chronic degenerative changes at L3-4, similar in appearance to previous MRI exam. No CT evidence for osteolysis and/or ongoing infection at this site. 7. Small fat containing paraumbilical hernia.  Assessment and plan- Patient is a 61 y.o. male presents for evaluation of lymphocytosis and also reporting constitutional symptoms.   1. Night sweats   2. Abnormal weight loss   3. Fatigue, unspecified type   4. Mediastinal lymphadenopathy   . #Patient's recent lymphocytosis may be due to recent steroid use and has resolved on most recent CBC.  TSH is normal, blood flow cytometry is negative for clonal process, LDH normal. Negative ANA.  Not explaining his persistent chronic constitutional symptoms.   Reviewing his medical records, he has history of ANA, for which he saw Dr.Bock one time and was determined low clinical suspicion for active autoimmune process. He supposed to follow up in a year which he has not. He also had elevated ESR and CRP. He also has latent TB, and per patient he has completed a course of antibiotics. He has CT chest done 9 months ago which showed medialstinal and hilar adenopathy, ? Consistent with TB?   I will repeat CT chest, abdomen and pelvis. Repeat CRP and ESR. I advise patient to make an appointment to follow up with [facility].  He will be contacted for pulmonology referral after the CT image comes back and reviewed.  Thank you for this kind referral and the opportunity to participate in the care of this  patient  F/U visit in 4 weeks.   Dr. Earlie Server, MD, PhD [facility] at [facility] Pager- [phone] 09/07/2017

## 2017-09-09 DIAGNOSIS — G44009 Cluster headache syndrome, unspecified, not intractable: Secondary | ICD-10-CM | POA: Diagnosis not present

## 2017-09-09 DIAGNOSIS — G8929 Other chronic pain: Secondary | ICD-10-CM | POA: Diagnosis not present

## 2017-09-09 DIAGNOSIS — G894 Chronic pain syndrome: Secondary | ICD-10-CM | POA: Diagnosis not present

## 2017-09-15 DIAGNOSIS — F1123 Opioid dependence with withdrawal: Secondary | ICD-10-CM | POA: Diagnosis not present

## 2017-09-15 DIAGNOSIS — M5410 Radiculopathy, site unspecified: Secondary | ICD-10-CM | POA: Insufficient documentation

## 2017-09-15 DIAGNOSIS — F1193 Opioid use, unspecified with withdrawal: Secondary | ICD-10-CM | POA: Insufficient documentation

## 2017-09-15 DIAGNOSIS — R52 Pain, unspecified: Secondary | ICD-10-CM | POA: Diagnosis not present

## 2017-09-21 ENCOUNTER — Ambulatory Visit
Admission: RE | Admit: 2017-09-21 | Discharge: 2017-09-21 | Disposition: A | Discharge status: Home / Self Care | Payer: PPO | Source: Ambulatory Visit | Attending: Oncology | Admitting: Oncology

## 2017-09-21 DIAGNOSIS — J439 Emphysema, unspecified: Secondary | ICD-10-CM | POA: Insufficient documentation

## 2017-09-21 DIAGNOSIS — M47816 Spondylosis without myelopathy or radiculopathy, lumbar region: Secondary | ICD-10-CM | POA: Diagnosis not present

## 2017-09-21 DIAGNOSIS — M87851 Other osteonecrosis, right femur: Secondary | ICD-10-CM | POA: Insufficient documentation

## 2017-09-21 DIAGNOSIS — I700 Atherosclerosis of aorta: Secondary | ICD-10-CM | POA: Diagnosis not present

## 2017-09-21 DIAGNOSIS — R599 Enlarged lymph nodes, unspecified: Secondary | ICD-10-CM | POA: Diagnosis not present

## 2017-09-21 DIAGNOSIS — K5641 Fecal impaction: Secondary | ICD-10-CM | POA: Diagnosis not present

## 2017-09-21 DIAGNOSIS — M87852 Other osteonecrosis, left femur: Secondary | ICD-10-CM | POA: Diagnosis not present

## 2017-09-21 DIAGNOSIS — M5136 Other intervertebral disc degeneration, lumbar region: Secondary | ICD-10-CM | POA: Insufficient documentation

## 2017-09-21 DIAGNOSIS — A150 Tuberculosis of lung: Secondary | ICD-10-CM | POA: Diagnosis not present

## 2017-09-21 DIAGNOSIS — R109 Unspecified abdominal pain: Secondary | ICD-10-CM | POA: Diagnosis not present

## 2017-09-21 DIAGNOSIS — R634 Abnormal weight loss: Secondary | ICD-10-CM | POA: Diagnosis not present

## 2017-09-21 MED ORDER — IOPAMIDOL (ISOVUE-300) INJECTION 61%
100.0000 mL | Freq: Once | INTRAVENOUS | Status: AC | PRN
  Administered 2017-09-21: 100 mL via INTRAVENOUS

## 2017-09-29 ENCOUNTER — Ambulatory Visit: Payer: PPO | Attending: Nurse Practitioner | Admitting: Nurse Practitioner

## 2017-09-30 NOTE — Progress Notes (Signed)
Hematology/Oncology Follow Up Note [name] Telephone:([phone] [phone]  Patient Care Team: Valera Castle, MD as PCP - General (Family Medicine) Amil Amen, MD as Referring Physician (Neurology)   Name of the patient: Gregory Cervantes  [id number]  02/03/1956    Reason for referral- /leukocytosis/ lymphocytosis   Referring physician- Dr. [name]  Date of visit: 09/30/17   History of presenting illness- 61 yo male who has PMH listed as below who was referred by [name] for evaluation of lymphocytosis. Patient reports having fatigue, drenching night sweat and some weight loss.  He had chronic back pain s/p previous laminectomy and discectomy, recently found to have lumbar radiculitis, cervical spinal stenosis and was taking steroids. He reports he finish his course of prednisone yesterday.  Also recently had umbilical hernia repair.  He has history of provoked DVT and PET after left knee surgery in [place] and he took warfarin for a few months and then stopped.   INTERVAL HISTORY Patient presents to discuss about the results. He continues to have profound fatigue and this symptom has been ongoing for the past 3 years. He also feels that he has problem with equilibrium. Patient tells me that he has been to multiple doctors in the past 3 years for the fatigue and has not figured out the reason. Denies cough, chest pain, fever or chills.  Review of systems- Review of Systems  Constitutional: Positive for weight loss. Negative for chills and fever.       Night sweat  HENT: Negative.  Negative for congestion, hearing loss, sinus pain and tinnitus.   Eyes: Negative.  Negative for double vision.  Respiratory: Negative.  Negative for cough and hemoptysis.   Cardiovascular: Negative.  Negative for chest pain, palpitations and leg swelling.  Gastrointestinal: Negative.  Negative for heartburn and vomiting.  Genitourinary: Negative.   Negative for dysuria.  Musculoskeletal: Positive for back pain and neck pain.  Skin: Negative for itching and rash.  Neurological: Positive for dizziness and sensory change.  Endo/Heme/Allergies: Negative.  Negative for environmental allergies. Does not bruise/bleed easily.  Psychiatric/Behavioral: Negative.  Negative for depression.    No Known Allergies  Patient Active Problem List   Diagnosis Date Noted  . Loss of weight 11/18/2016  . Early satiety 11/18/2016  . Nausea 09/04/2016  . TB lung, latent 08/28/2016  . Night sweats 08/28/2016  . Weight gain 08/28/2016  . Smoker 08/28/2016  . Neck pain, acute 08/04/2016  . Arthritis 05/18/2016  . Benign fibroma of prostate 05/18/2016  . Dental disease 05/18/2016  . ANA positive 03/31/2016  . Arthralgia of multiple joints 03/31/2016  . Elevated sed rate 03/31/2016  . Chronic fatigue 03/25/2016  . Contact with and exposure to tuberculosis 03/25/2016  . Numbness and tingling 03/05/2016  . Arthritis, degenerative 01/22/2016  . 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 Breast lump 04/15/2015  . Clinical depression 10/22/2014  . Major depressive disorder with single episode 10/22/2014  . Exomphalos 08/02/2014  . Avascular necrosis of bone of hip ([place]) 08/01/2014  . Idiopathic aseptic necrosis of bone ([place]) 08/01/2014  . Arthropathia 07/26/2014  . Benign prostatic hypertrophy without urinary obstruction 07/26/2014  . Disease of supporting structures of teeth 07/26/2014  . Chronic pain associated with significant psychosocial dysfunction 04/12/2014  . History of cervical spinal surgery 04/12/2014  . Failed back syndrome of lumbar spine 04/12/2014  . Degenerative arthritis of lumbar spine 04/12/2014  . L-S radiculopathy 04/12/2014  . Vascular disorder of lower extremity 04/09/2014  .  History of anticoagulant therapy 04/09/2014  . Articular cartilage disease 03/28/2014  . Defect of articular cartilage 03/28/2014  . Accumulation  of fluid in tissues 03/28/2014  . Edema, pitting 03/28/2014  . Current tear of lateral cartilage or meniscus of knee 03/28/2014  . Current tear knee, lateral meniscus 03/28/2014  . Gonalgia 02/14/2014  . Arthralgia of lower leg 02/14/2014  . Abnormal blood sugar 11/01/2013  . Disorder of eye movements 11/01/2013  . Candidiasis of esophagus ([place]) 11/01/2013  . Abnormal eye movements 11/01/2013  . Cephalalgia 11/01/2013  . Blood glucose elevated 11/01/2013  . Elevated WBC count 11/01/2013  . Long term current use of opiate analgesic 09/25/2013  . Other long term (current) drug therapy 09/25/2013  . Back ache 10/08/2012  . Back pain, thoracic 10/08/2012  . 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 Testicular hypofunction 10/04/2012  . Back pain, chronic 09/29/2012  . Insomnia 09/29/2012  . 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 Idiopathic localized osteoarthropathy 11/18/2011  . Cervical post-laminectomy syndrome 09/01/2011  . Narrowing of intervertebral disc space 11/28/2003  . Low back pain 11/28/2003     Past Medical History:  Diagnosis Date  . Arthritis   . BPH (benign prostatic hyperplasia)   . Chronic back pain   . Early satiety 11/18/2016  . Headache   . History of pulmonary embolus (PE)   . Insomnia   . Loss of weight 11/18/2016  . Night sweat 08/19/2016  . Peripheral neuropathy   . Smoker 08/19/2016  . TB lung, latent 08/19/2016  . Weight gain 08/19/2016     Past Surgical History:  Procedure Laterality Date  . BACK SURGERY    . BRAIN SURGERY     x5- spinal fluid leaks  . CERVICAL FUSION    . DG SHOULDER RIGHT COMPLETE     x6  . HAND SURGERY Left   . HERNIA REPAIR    . KNEE ARTHROSCOPY Left    x3  . PROSTATE SURGERY    . UMBILICAL HERNIA REPAIR N/A 08/17/2017   Procedure: HERNIA REPAIR UMBILICAL ADULT;  Surgeon: Leonie Green, MD;  Location: [facility] ORS;  Service: General;  Laterality: N/A;  . WISDOM TOOTH EXTRACTION      Social History   Social History  . Marital  status: Divorced    Spouse name: N/A  . Number of children: N/A  . Years of education: N/A   Occupational History  . Not on file.   Social History Main Topics  . Smoking status: Current Every Day Smoker    Packs/day: 0.50    Types: Cigarettes  . Smokeless tobacco: Never Used  . Alcohol use No     Comment: three times a year  . Drug use: No  . Sexual activity: No   Other Topics Concern  . Not on file   Social History Narrative  . No narrative on file     Family History  Problem Relation Age of Onset  . Cancer Mother        pancreatic and lung  . Cancer Father        leukemia  . Anxiety disorder Sister   . Anxiety disorder Sister   . Anxiety disorder Sister      Current Outpatient Prescriptions:  .  gabapentin (NEURONTIN) 400 MG capsule, Take 400 mg by mouth 5 (five) times daily as needed (for pain.). , Disp: , Rfl:  .  Multiple Vitamin (MULTIVITAMIN WITH MINERALS) TABS tablet, Take 1 tablet by mouth daily., Disp: , Rfl:  .  naloxone (NARCAN) 0.4 MG/ML injection, Inject 0.4 mg into the muscle every 2 (two) hours as needed (for opoid overuse.). , Disp: , Rfl:  .  ondansetron (ZOFRAN) 4 MG tablet, Take 1 tablet (4 mg total) by mouth every 8 (eight) hours as needed for nausea or vomiting., Disp: 30 tablet, Rfl: 6 .  OxyCODONE HCl 30 MG TABA, Take 30 mg by mouth 3 (three) times daily., Disp: , Rfl:  .  oxyCODONE-acetaminophen (ROXICET) 5-325 MG tablet, Take 1-2 tablets by mouth every 4 (four) hours as needed for moderate pain or severe pain. (Patient not taking: Reported on 08/20/2017), Disp: 12 tablet, Rfl: 0 .  QUEtiapine (SEROQUEL) 100 MG tablet, TAKE 1 TABLET (100 MG TOTAL) BY MOUTH AT BEDTIME., Disp: 30 tablet, Rfl: 1 .  SUMAtriptan (IMITREX) 6 MG/0.5ML SOLN injection, Inject 0.6 mg into the skin 2 (two) times daily as needed (*may repeat dose in 2 hrs--max 2/24hr*for migraine/cluster headaches.). , Disp: , Rfl:    Physical exam:  Vitals:   10/01/17 1120  BP: 102/61    Pulse: 80  Resp: 18  Temp: (!) 97.1 F (36.2 C)  TempSrc: Tympanic  Weight: 187 lb 6.4 oz (85 kg)   Physical Exam  GENERAL: GENERAL: No distress, well nourished.  SKIN:  No rashes or significant lesions  HEAD: Normocephalic, No masses, lesions, tenderness or abnormalities  EYES: Conjunctiva are pink, non icteric ENT: External ears normal ,lips , buccal mucosa, and tongue normal and mucous membranes are moist  LYMPH: No palpable cervical and axillary lymphadenopathy  LUNGS: Clear to auscultation, no crackles or wheezes HEART: Regular rate & rhythm, no murmurs, no gallops, S1 normal and S2 normal  ABDOMEN: Abdomen soft, non-tender, normal bowel sounds, I did not appreciate any  masses or organomegaly  MUSCULOSKELETAL: No CVA tenderness and no tenderness on percussion of the back or rib cage.  EXTREMITIES: No edema, no skin discoloration or tenderness NEURO: Alert & oriented    CMP Latest Ref Rng & Units 08/20/2017  Glucose 65 - 99 mg/dL 109(H)  BUN 6 - 20 mg/dL 17  Creatinine 0.61 - 1.24 mg/dL 0.81  Sodium 135 - 145 mmol/L 134(L)  Potassium 3.5 - 5.1 mmol/L 4.5  Chloride 101 - 111 mmol/L 100(L)  CO2 22 - 32 mmol/L 26  Calcium 8.9 - 10.3 mg/dL 8.9  Total Protein 6.5 - 8.1 g/dL 6.8  Total Bilirubin 0.3 - 1.2 mg/dL 0.5  Alkaline Phos 38 - 126 U/L 39  AST 15 - 41 U/L 32  ALT 17 - 63 U/L 26   CBC Latest Ref Rng & Units 08/20/2017  WBC 3.8 - 10.6 K/uL 8.5  Hemoglobin 13.0 - 18.0 g/dL 13.9  Hematocrit 40.0 - 52.0 % 39.4(L)  Platelets 150 - 440 K/uL 217   IMAGES I have reviewed the images below personally:  CT chest w contrast 09/21/2017 IMPRESSION: 1. Stable mild adenopathy in the chest, nonspecific. No findings of active pulmonary tuberculosis. 2.  Prominent stool throughout the colon favors constipation. 3. Aortic Atherosclerosis (ICD10-I70.0) and Emphysema (ICD10-J43.9). 4. Lumbar spondylosis and degenerative disc disease causing multilevel impingement. 5. Avascular  necrosis in both femoral heads without collapse Assessment and plan- Patient is a 61 y.o. male presents for evaluation of lymphocytosis and also reporting constitutional symptoms.   1. Abnormal weight loss   2. Night sweats   3. Fatigue, unspecified type   4. Mediastinal lymphadenopathy   . #Patient's recent lymphocytosis may be due to recent steroid use and has resolved on most  recent CBC.  Discussed with patient that TSH is normal, blood flow cytometry is negative for clonal process, LDH normal. Negative ANA.  CT results reviewed with patient and the mild lymphadenopathy in the chest has not changed. There is no findings of active lung tuberculosis.  The CT scan and lab work are not explaining his persistent chronic constitutional symptoms.   Reviewing his medical records, he has history of ANA, for which he saw Dr.Bock one time and was determined low clinical suspicion for active autoimmune process. He supposed to follow up in a year which he has not. He also had elevated ESR and CRP.  Repeat CRP and ESR showed persistently elevated ESR, but normal CRP. I advised patient to follow up with Dr. Meda Coffee and he has not done it yet. I advise patient to make an appointment to follow up with [facility]. Patient voices understanding. Thank you for this kind referral and the opportunity to participate in the care of this patient  F/U  As needed.   Dr. Earlie Server, MD, PhD [facility] at [facility] Pager- [phone] 09/30/2017

## 2017-10-01 ENCOUNTER — Encounter: Payer: Self-pay | Admitting: Oncology

## 2017-10-01 ENCOUNTER — Inpatient Hospital Stay: Payer: PPO | Attending: Oncology | Admitting: Oncology

## 2017-10-01 VITALS — BP 102/61 | HR 80 | Temp 97.1°F | Resp 18 | Wt 187.4 lb

## 2017-10-01 DIAGNOSIS — Z79899 Other long term (current) drug therapy: Secondary | ICD-10-CM | POA: Diagnosis not present

## 2017-10-01 DIAGNOSIS — M542 Cervicalgia: Secondary | ICD-10-CM | POA: Diagnosis not present

## 2017-10-01 DIAGNOSIS — M1990 Unspecified osteoarthritis, unspecified site: Secondary | ICD-10-CM | POA: Diagnosis not present

## 2017-10-01 DIAGNOSIS — R590 Localized enlarged lymph nodes: Secondary | ICD-10-CM

## 2017-10-01 DIAGNOSIS — D72829 Elevated white blood cell count, unspecified: Secondary | ICD-10-CM | POA: Insufficient documentation

## 2017-10-01 DIAGNOSIS — R700 Elevated erythrocyte sedimentation rate: Secondary | ICD-10-CM | POA: Diagnosis not present

## 2017-10-01 DIAGNOSIS — J439 Emphysema, unspecified: Secondary | ICD-10-CM | POA: Diagnosis not present

## 2017-10-01 DIAGNOSIS — R61 Generalized hyperhidrosis: Secondary | ICD-10-CM

## 2017-10-01 DIAGNOSIS — F17210 Nicotine dependence, cigarettes, uncomplicated: Secondary | ICD-10-CM

## 2017-10-01 DIAGNOSIS — N400 Enlarged prostate without lower urinary tract symptoms: Secondary | ICD-10-CM | POA: Insufficient documentation

## 2017-10-01 DIAGNOSIS — M5416 Radiculopathy, lumbar region: Secondary | ICD-10-CM | POA: Diagnosis not present

## 2017-10-01 DIAGNOSIS — G894 Chronic pain syndrome: Secondary | ICD-10-CM | POA: Diagnosis not present

## 2017-10-01 DIAGNOSIS — Z86718 Personal history of other venous thrombosis and embolism: Secondary | ICD-10-CM | POA: Diagnosis not present

## 2017-10-01 DIAGNOSIS — D72820 Lymphocytosis (symptomatic): Secondary | ICD-10-CM

## 2017-10-01 DIAGNOSIS — M4802 Spinal stenosis, cervical region: Secondary | ICD-10-CM | POA: Diagnosis not present

## 2017-10-01 DIAGNOSIS — R634 Abnormal weight loss: Secondary | ICD-10-CM | POA: Diagnosis not present

## 2017-10-01 DIAGNOSIS — R5383 Other fatigue: Secondary | ICD-10-CM

## 2017-10-01 DIAGNOSIS — Z806 Family history of leukemia: Secondary | ICD-10-CM | POA: Diagnosis not present

## 2017-10-01 NOTE — Progress Notes (Signed)
Here for follow up

## 2017-10-06 ENCOUNTER — Encounter: Payer: Self-pay | Admitting: Nurse Practitioner

## 2017-10-06 ENCOUNTER — Ambulatory Visit: Payer: PPO | Attending: Nurse Practitioner | Admitting: Nurse Practitioner

## 2017-10-06 VITALS — BP 129/86 | HR 90 | Temp 98.4°F | Resp 16 | Ht 71.0 in | Wt 185.0 lb

## 2017-10-06 DIAGNOSIS — M25551 Pain in right hip: Secondary | ICD-10-CM | POA: Diagnosis not present

## 2017-10-06 DIAGNOSIS — G44099 Other trigeminal autonomic cephalgias (TAC), not intractable: Secondary | ICD-10-CM | POA: Diagnosis not present

## 2017-10-06 DIAGNOSIS — M25552 Pain in left hip: Secondary | ICD-10-CM | POA: Diagnosis not present

## 2017-10-06 DIAGNOSIS — G4700 Insomnia, unspecified: Secondary | ICD-10-CM | POA: Insufficient documentation

## 2017-10-06 DIAGNOSIS — Z789 Other specified health status: Secondary | ICD-10-CM | POA: Diagnosis not present

## 2017-10-06 DIAGNOSIS — M79605 Pain in left leg: Secondary | ICD-10-CM | POA: Diagnosis not present

## 2017-10-06 DIAGNOSIS — Z79891 Long term (current) use of opiate analgesic: Secondary | ICD-10-CM

## 2017-10-06 DIAGNOSIS — M25569 Pain in unspecified knee: Secondary | ICD-10-CM

## 2017-10-06 DIAGNOSIS — M79604 Pain in right leg: Secondary | ICD-10-CM | POA: Diagnosis not present

## 2017-10-06 DIAGNOSIS — M5442 Lumbago with sciatica, left side: Secondary | ICD-10-CM | POA: Insufficient documentation

## 2017-10-06 DIAGNOSIS — M25561 Pain in right knee: Secondary | ICD-10-CM | POA: Diagnosis not present

## 2017-10-06 DIAGNOSIS — G8929 Other chronic pain: Secondary | ICD-10-CM | POA: Insufficient documentation

## 2017-10-06 DIAGNOSIS — Z79899 Other long term (current) drug therapy: Secondary | ICD-10-CM

## 2017-10-06 DIAGNOSIS — M533 Sacrococcygeal disorders, not elsewhere classified: Secondary | ICD-10-CM | POA: Diagnosis not present

## 2017-10-06 DIAGNOSIS — R519 Headache, unspecified: Secondary | ICD-10-CM

## 2017-10-06 DIAGNOSIS — M542 Cervicalgia: Secondary | ICD-10-CM | POA: Diagnosis not present

## 2017-10-06 DIAGNOSIS — G44009 Cluster headache syndrome, unspecified, not intractable: Secondary | ICD-10-CM | POA: Diagnosis not present

## 2017-10-06 DIAGNOSIS — M899 Disorder of bone, unspecified: Secondary | ICD-10-CM

## 2017-10-06 DIAGNOSIS — M25562 Pain in left knee: Secondary | ICD-10-CM

## 2017-10-06 DIAGNOSIS — N400 Enlarged prostate without lower urinary tract symptoms: Secondary | ICD-10-CM | POA: Diagnosis not present

## 2017-10-06 DIAGNOSIS — F17210 Nicotine dependence, cigarettes, uncomplicated: Secondary | ICD-10-CM | POA: Diagnosis not present

## 2017-10-06 DIAGNOSIS — M5441 Lumbago with sciatica, right side: Secondary | ICD-10-CM | POA: Insufficient documentation

## 2017-10-06 DIAGNOSIS — F329 Major depressive disorder, single episode, unspecified: Secondary | ICD-10-CM | POA: Diagnosis not present

## 2017-10-06 DIAGNOSIS — R51 Headache: Secondary | ICD-10-CM | POA: Diagnosis not present

## 2017-10-06 NOTE — Patient Instructions (Signed)

## 2017-10-06 NOTE — Progress Notes (Signed)
Safety precautions to be maintained throughout the outpatient stay will include: orient to surroundings, keep bed in low position, maintain call bell within reach at all times, provide assistance with transfer out of bed and ambulation.  

## 2017-10-06 NOTE — Progress Notes (Addendum)
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 minutes late to your appointment with the provider. 2. Tardiness:  What happens if I arrive only a few minutes after my scheduled appointment time? You will need to reschedule your appointment. The cutoff is your appointment time. This is why it is so important that you arrive at least 30 minutes before that appointment.  If you have an appointment scheduled for 10:00 AM and you arrive at 10:01, you will be required to reschedule your appointment.  3. Plan ahead:  Always assume that you will encounter traffic on your way in. Plan for it. If you are dependent on a driver, make sure they understand these rules and the need to arrive early. 4. Other appointments and responsibilities:  Avoid scheduling any other appointments before or after your pain clinic appointments.  5. Be prepared:  Write down everything that you need to discuss with your healthcare provider and give this information to the admitting nurse. Write down the medications that you will need refilled. Bring your pills and bottles (even the empty ones), to all of your appointments, except for those where a procedure is scheduled. 6. No children or pets:  Find someone to take care of them. It is not appropriate to bring them in. 7. Scheduling changes:  We request "advanced notification" of any changes or cancellations. 8. Advanced notification:  Defined as a time period of more than 24 hours prior to the originally scheduled appointment. This allows for the appointment to be offered to other patients. 9. Rescheduling:  When a visit is rescheduled, it will require the cancellation of the original appointment. For this  reason they both fall within the category of "Cancellations".  10. Cancellations:  They require advanced notification. Any cancellation less than 24 hours before the  appointment will be recorded as a "No Show". 11. No Show:  Defined as an unkept appointment where the patient failed to notify or declare to the practice their intention or inability to keep the appointment.  Corrective process for repeat offenders:  1. Tardiness: Three (3) episodes of rescheduling due to late arrivals will be recorded as one (1) "No Show". 2. Cancellation or reschedule: Three (3) cancellations or rescheduling will be recorded as one (1) "No Show". 3. "No Shows": Three (3) "No Shows" within a 12 month period will result in discharge from the practice.  ____________________________________________________________________________________________  ____________________________________________________________________________________________  Pain Scale  Introduction: The pain score used by this practice is the Verbal Numerical Rating Scale (VNRS-11). This is an 11-point scale. It is for adults and children 10 years or older. There are significant differences in how the pain score is reported, used, and applied. Forget everything you learned in the past and learn this scoring system.  General Information: The scale should reflect your current level of pain. Unless you are specifically asked for the level of your worst pain, or your average pain. If you are asked for one of these two, then it should be understood that it is over the past 24 hours.  Basic Activities of Daily Living (ADL): Personal hygiene, dressing, eating, transferring, and using restroom.  Instructions: Most patients tend to report their level of pain as a combination of two factors, their physical pain and their psychosocial pain. This last one is also known as "suffering" and it is reflection of how physical pain affects you socially and  psychologically. From now on, report them separately. From this point on, when asked to report your pain level, report only your physical pain. Use the following table for reference.  Pain Clinic Pain Levels (0-5/10)  Pain Level Score  Description  No Pain 0   Mild pain 1 Nagging, annoying, but does not interfere with basic activities of daily living (ADL). Patients are able to eat, bathe, get dressed, toileting (being able to get on and off the toilet and perform  personal hygiene functions), transfer (move in and out of bed or a chair without assistance), and maintain continence (able to control bladder and bowel functions). Blood pressure and heart rate are unaffected. A normal heart rate for a healthy adult ranges from 60 to 100 bpm (beats per minute).   Mild to moderate pain 2 Noticeable and distracting. Impossible to hide from other people. More frequent flare-ups. Still possible to adapt and function close to normal. It can be very annoying and may have occasional stronger flare-ups. With discipline, patients may get used to it and adapt.   Moderate pain 3 Interferes significantly with activities of daily living (ADL). It becomes difficult to feed, bathe, get dressed, get on and off the toilet or to perform personal hygiene functions. Difficult to get in and out of bed or a chair without assistance. Very distracting. With effort, it can be ignored when deeply involved in activities.   Moderately severe pain 4 Impossible to ignore for more than a few minutes. With effort, patients may still be able to manage work or participate in some social activities. Very difficult to concentrate. Signs of autonomic nervous system discharge are evident: dilated pupils (mydriasis); mild sweating (diaphoresis); sleep interference. Heart rate becomes elevated (>115 bpm). Diastolic blood pressure (lower number) rises above 100 mmHg. Patients find relief in laying down and not moving.   Severe pain 5 Intense and  extremely unpleasant. Associated with frowning face and frequent crying. Pain overwhelms the senses.  Ability to do any activity or maintain social relationships becomes significantly limited. Conversation becomes difficult. Pacing back and forth is common, as getting into a comfortable position is nearly impossible. Pain wakes you up from deep sleep. Physical signs will be obvious: pupillary dilation; increased sweating; goosebumps; brisk reflexes; cold, clammy hands and feet; nausea, vomiting or dry heaves; loss of appetite; significant sleep disturbance with inability to fall asleep or to remain asleep. When persistent, significant weight loss is observed due to the complete loss of appetite and sleep deprivation.  Blood pressure and heart rate becomes significantly elevated. Caution: If elevated blood pressure triggers a pounding headache associated with blurred vision, then the patient should immediately seek attention at an urgent or emergency care unit, as these may be signs of an impending stroke.    Emergency Department Pain Levels (6-10/10)  Emergency Room Pain 6 Severely limiting. Requires emergency care and should not be seen or managed at an outpatient pain management facility. Communication becomes difficult and requires great effort. Assistance to reach the emergency department may be required. Facial flushing and profuse sweating along with potentially dangerous increases in heart rate and blood pressure will be evident.   Distressing pain 7 Self-care is very difficult. Assistance is required to transport, or use restroom. Assistance to reach the emergency department will be required. Tasks requiring coordination, such as bathing and getting dressed become very difficult.   Disabling pain 8 Self-care is no longer possible. At this level, pain is disabling. The individual is unable to do even the most "basic" activities such as walking, eating, bathing, dressing, transferring to a bed, or  toileting. Fine motor skills are lost. It is difficult to think clearly.   Incapacitating pain 9 Pain becomes incapacitating. Thought processing is no longer possible. Difficult to remember your own name. Control of movement and coordination are lost.   The worst pain imaginable 10 At this level, most patients pass out from pain. When this level is reached, collapse of the autonomic nervous system occurs,  leading to a sudden drop in blood pressure and heart rate. This in turn results in a temporary and dramatic drop in blood flow to the brain, leading to a loss of consciousness. Fainting is one of the body's self defense mechanisms. Passing out puts the brain in a calmed state and causes it to shut down for a while, in order to begin the healing process.    Summary: 1. Refer to this scale when providing [place] with your pain level. 2. Be accurate and careful when reporting your pain level. This will help with your care. 3. Over-reporting your pain level will lead to loss of credibility. 4. Even a level of 1/10 means that there is pain and will be treated at our facility. 5. High, inaccurate reporting will be documented as "Symptom Exaggeration", leading to loss of credibility and suspicions of possible secondary gains such as obtaining more narcotics, or wanting to appear disabled, for fraudulent reasons. 6. Only pain levels of 5 or below will be seen at our facility. 7. Pain levels of 6 and above will be sent to the Emergency Department and the appointment cancelled. ____________________________________________________________________________________________

## 2017-10-10 LAB — COMP. METABOLIC PANEL (12)
ALBUMIN: 4 g/dL (ref 3.6–4.8)
AST: 21 IU/L (ref 0–40)
Albumin/Globulin Ratio: 1.7 (ref 1.2–2.2)
Alkaline Phosphatase: 52 IU/L (ref 39–117)
BILIRUBIN TOTAL: 0.2 mg/dL (ref 0.0–1.2)
BUN / CREAT RATIO: 12 (ref 10–24)
BUN: 10 mg/dL (ref 8–27)
Calcium: 9.5 mg/dL (ref 8.6–10.2)
Chloride: 104 mmol/L (ref 96–106)
Creatinine, Ser: 0.82 mg/dL (ref 0.76–1.27)
GFR calc Af Amer: 111 mL/min/{1.73_m2} (ref >59)
GFR, EST NON AFRICAN AMERICAN: 96 mL/min/{1.73_m2} (ref >59)
GLOBULIN, TOTAL: 2.4 g/dL (ref 1.5–4.5)
Glucose: 81 mg/dL (ref 65–99)
POTASSIUM: 4.3 mmol/L (ref 3.5–5.2)
SODIUM: 142 mmol/L (ref 134–144)
Total Protein: 6.4 g/dL (ref 6.0–8.5)

## 2017-10-10 LAB — 25-HYDROXY VITAMIN D LCMS D2+D3
25-Hydroxy, Vitamin D-2: <1 ng/mL
25-Hydroxy, Vitamin D: 41 ng/mL

## 2017-10-10 LAB — MAGNESIUM: Magnesium: 2 mg/dL (ref 1.6–2.3)

## 2017-10-10 LAB — SEDIMENTATION RATE: SED RATE: 36 mm/h — AB (ref 0–30)

## 2017-10-10 LAB — 25-HYDROXYVITAMIN D LCMS D2+D3: 25-HYDROXY, VITAMIN D-3: 40 ng/mL

## 2017-10-10 LAB — C-REACTIVE PROTEIN: CRP: 14.3 mg/L — AB (ref 0.0–4.9)

## 2017-10-10 LAB — VITAMIN B12: Vitamin B-12: 684 pg/mL (ref 232–1245)

## 2017-10-12 LAB — COMPLIANCE DRUG ANALYSIS, UR

## 2017-10-14 DIAGNOSIS — S060X0A Concussion without loss of consciousness, initial encounter: Secondary | ICD-10-CM | POA: Diagnosis not present

## 2017-10-14 DIAGNOSIS — R5382 Chronic fatigue, unspecified: Secondary | ICD-10-CM | POA: Diagnosis not present

## 2017-10-14 DIAGNOSIS — G894 Chronic pain syndrome: Secondary | ICD-10-CM | POA: Diagnosis not present

## 2017-10-14 DIAGNOSIS — G63 Polyneuropathy in diseases classified elsewhere: Secondary | ICD-10-CM | POA: Diagnosis not present

## 2017-10-14 DIAGNOSIS — G44009 Cluster headache syndrome, unspecified, not intractable: Secondary | ICD-10-CM | POA: Diagnosis not present

## 2017-10-18 ENCOUNTER — Other Ambulatory Visit: Payer: Self-pay

## 2017-10-20 ENCOUNTER — Ambulatory Visit: Admission: RE | Admit: 2017-10-20 | Payer: PPO | Source: Ambulatory Visit | Admitting: Unknown Physician Specialty

## 2017-10-20 ENCOUNTER — Encounter: Admission: RE | Payer: Self-pay | Source: Ambulatory Visit

## 2017-10-20 SURGERY — ESOPHAGOGASTRODUODENOSCOPY (EGD) WITH PROPOFOL
Anesthesia: General

## 2017-10-21 DIAGNOSIS — M542 Cervicalgia: Secondary | ICD-10-CM | POA: Diagnosis not present

## 2017-10-21 DIAGNOSIS — M5000 Cervical disc disorder with myelopathy, unspecified cervical region: Secondary | ICD-10-CM | POA: Diagnosis not present

## 2017-10-25 DIAGNOSIS — R5382 Chronic fatigue, unspecified: Secondary | ICD-10-CM | POA: Diagnosis not present

## 2017-10-25 DIAGNOSIS — G894 Chronic pain syndrome: Secondary | ICD-10-CM | POA: Diagnosis not present

## 2017-10-25 DIAGNOSIS — G63 Polyneuropathy in diseases classified elsewhere: Secondary | ICD-10-CM | POA: Diagnosis not present

## 2017-10-27 DIAGNOSIS — F54 Psychological and behavioral factors associated with disorders or diseases classified elsewhere: Secondary | ICD-10-CM | POA: Diagnosis not present

## 2017-11-07 DIAGNOSIS — N400 Enlarged prostate without lower urinary tract symptoms: Secondary | ICD-10-CM | POA: Insufficient documentation

## 2017-11-07 DIAGNOSIS — M153 Secondary multiple arthritis: Secondary | ICD-10-CM | POA: Insufficient documentation

## 2017-11-07 NOTE — Progress Notes (Signed)
Patient's Name: Gregory Cervantes  MRN: [id number]  Referring Provider: Valera Castle, *  DOB: 10/12/1956  PCP: Valera Castle, MD  DOS: 11/08/2017  Note by: Gaspar Cola, MD  Service setting: Ambulatory outpatient  Specialty: Interventional Pain Management  Location: [facility] (AMB) Pain Management Facility    Patient type: Established   Primary Reason(s) for Visit: Encounter for evaluation before starting new chronic pain management plan of care (Level of risk: moderate) CC: Neck Pain; Back Pain (low); Hip Pain (bilateral); Foot Pain (bilateral); and Knee Pain (left)  HPI  Gregory Cervantes is a 61 y.o. year old, male patient, who comes today for a follow-up evaluation to review the test results and decide on a treatment plan. He has Vascular disorder of lower extremity; Back pain, chronic; Chronic fatigue; Long term current use of opiate analgesic; Bing-Horton syndrome; Narrowing of intervertebral disc space; Dental disease; Clinical depression; Disorder of eye movements; Back pain, thoracic; Elevated sed rate; History of anticoagulant therapy; Other long term (current) drug therapy; Contact with and exposure to tuberculosis; Abnormal eye movements; Exomphalos; Eunuchoidism; Insomnia; Left breast lump; Failed back syndrome of lumbar spine; Numbness and tingling; Cervical post-laminectomy syndrome; Current tear knee, lateral meniscus; Neck pain, acute; TB lung, latent; Night sweats; Weight gain; Smoker; Nausea; Loss of weight; Early satiety; Chronic lower extremity pain (Secondary Area of Pain) (Bilateral) (L>R); Chronic low back pain [facility] Area of Pain) (Bilateral) (L>R); Chronic hip pain (Fourth Area of Pain) (Bilateral) (L>R); Chronic sacroiliac joint pain (R); Knee pain, chronic (Bilateral)(L>R); Disorder of bone, unspecified; Other specified health status; Chronic use of opiate drugs therapeutic purposes; Opioid withdrawal ([place]); Post-traumatic osteoarthritis of multiple joints;  Arthralgia of multiple sites; Arthropathy; Articular cartilage disorder; Degeneration of intervertebral disc, site unspecified; Lumbar spondylosis; Osteoarthritis; BPH (benign prostatic hyperplasia); Cluster headache syndrome, not intractable; Chronic pain syndrome; Chronic painful diabetic neuropathy ([place]); Hypogonadism male; Umbilical hernia without obstruction or gangrene; Cervical spinal stenosis; Lumbar radiculitis; Lumbosacral radiculopathy; Radiculopathy; Primary localized osteoarthrosis, lower leg; Secondary adrenal insufficiency ([place]); Cervicogenic headache (Primary Area of Pain) (Left); and Steroid-induced avascular necrosis of femoral head (Bilateral) on their problem list. His primarily concern today is the Neck Pain; Back Pain (low); Hip Pain (bilateral); Foot Pain (bilateral); and Knee Pain (left)  Pain Assessment: Location: Lower, Upper Back Radiating: hips and feet Onset: More than a month ago Duration: Chronic pain Quality: Burning, Constant Severity: 7 /10 (self-reported pain score)  Note: Reported level is inconsistent with clinical observations. Clinically the patient looks like a 2/10 A 2/10 is viewed as "Mild to Moderate" and described as noticeable and distracting. Impossible to hide from other people. More frequent flare-ups. Still possible to adapt and function close to normal. It can be very annoying and may have occasional stronger flare-ups. With discipline, patients may get used to it and adapt. Information on the proper use of the pain scale provided to the patient today. When using our objective Pain Scale, levels between 6 and 10/10 are said to belong in an emergency room, as it progressively worsens from a 6/10, described as severely limiting, requiring emergency care not usually available at an outpatient pain management facility. At a 6/10 level, communication becomes difficult and requires great effort. Assistance to reach the emergency department may be required. Facial  flushing and profuse sweating along with potentially dangerous increases in heart rate and blood pressure will be evident. Timing: Constant Modifying factors: medications, ice  [name] comes in today for a follow-up visit after his initial evaluation  on 10/06/2017. However, approximately 5 years ago I evaluated this patient. I did order some testing and offered him a treatment plan which he decided against. At the time he was more concerned about obtaining pain medications and he ended up switching his care to the [facility]. He describes that this was a nightmare and he ended up moving to [place]. I'm not entirely sure what his motive to move to [place] was but at the time, opioid loss and pain clinic relations were rather loose in that state. In addition, the patient has been seen at the [facility] pain clinic and the [facility] pain clinic. Today we went over the results of his tests. These were explained in "Layman's terms". During today's appointment we went over my diagnostic impression, as well as the proposed treatment plan.  Review of pain problems: According to the patient his primary pain is headaches. He states that he has been diagnosed with cluster headaches since the age of 61. He is S/P trigeminal decompression 8 years ago in [place]. According to him, he did have complications; CSF leak, meningitis and infections. He admits that he did have Botox injections once; not effective. He did occipital peripheral nerve stimulator trial and found it not effective. He had MRI 8 months ago in [place] [facility]). I have reviewed an MRI of the cervical spine done on 07/18/2009 at the [facility] in [place]. This MRI reveals that the patient has hardware in the cervical spine, and from a prior cervical spine surgery. He also shows spinal stenosis at the C2-3 level that could be compatible with problems at this C2 and C3 cervical nerve roots. Irritation of these will lead to  occipital headaches (cervicogenic headaches).  His second area of pain is in his feet. He admits the right is worse than the left. He has Peripheral neuropathy . He has burning, and weakness in his lower extremities. He has NCS pending, but missed it. He is schedule for the end of the month. Review of medical records would suggest that the patient may be having problems with diabetic peripheral neuropathy. However, levels of Hemoglobin A1C have never been elevated, when tested. The patient does have an EMG/PNCV done on 01/03/2014 at the [facility] neurology Department showing that the patient has a chronic right-sided S1 radiculopathy. At that time, there did not seem to be evidence of a peripheral neuropathy. However, a more recent EMG/PNCV conducted at a [facility] on 11/01/2015 didn't show a generalized peripheral neuropathy with axonal features [facility]).  His third area of pain is in his knees. He is S/P Left, partial knee replacement. He also has right knee pain. He denies any physical therapy or recent images.   His fourth area of pain is in his lower back. The left is greater than the right. The pain goes into his buttocks. He is S/P laminectomy and discectomy of lumbar spine involving L3-L5. He has had multiple procedures; 20. His last procedure was 4 years ago. He admits facet injections offer short term releif, however the RFA are most effective. He denies any PT. He is unsure of recent images.   His fifth area of pain is in his hips. He admits the left is greater than the right. Recent studies have shown that the patient has aseptic necrosis of both femoral heads without collapse. This appears to be secondary to chronic oral steroid use. Review of the medical records would suggest the patient had an exposure to tuberculosis and  for respiratory reasons he was consuming high amounts of steroids for a long period time. He denies any surgery, interventional therapy, physical  therapy or images.   He describes having suffered steroid "poisoning" (side-effects) in the past from monthly oral dosing for his headaches. He claims to have been taking Prednisone 1,000 mg/mo x 20 years. However, review of the medical records would indicate that he recently finished another course of steroids.  Review of medication problems: The patient has been identified as having problems not only from his chronic pain, but also from his use of medications.  - Inaccurate description of medication intake: When he last came to our office he indicated taking oxycodone/APAP 5/325 one tablet every 4 hours for a total of approximately 30 mg/day of oxycodone. According to his description, he was taking an average of 45 MME/day. However during today's visit, he has come to light that the patient is taking Oxycontin 30 mg TID (90 mg/day) + Oxycodone IR 5 mg q6 hrs (20 mg/day). (110 mg/day of Oxycodone). This represents a reported intake of approximately 165 MME/day. - High daily dose: Based on his own description of the amount of medicine that he takes, it is estimated that he is consuming approximately 165 MME/day. This is without taking into consideration the unreported morphine found him his UDS. They UDS indicates that the amount of morphine found in space system is above the amount of oxycodone found. - Tolerance: By the patient's own description and review of medical records, it would seem that the patient has been taking opioids for a number years without ever addressing the issue of tolerance by way of "drug holidays". - PMP mismatch: Review of the PMP reveals that the amount of medication the patient is currently describing as being taken, it is not being supplied to him in that manner. - Abnormal UDS: Review of the UDS results from the patient's initial evaluation confirm that the patient has been taking morphine from an unreported source. PMP does not account for the source and therefore we have to  assume that the sources illicit. Furthermore, the patient denies that he has been taking this morphine. However, they UDS conducted on 10/06/2017 demonstrates that the patient also had in his system unreported morphine and alprazolam. While levels of the oxycodone metabolites were found to be 2,105 ng/mg of creatinine, for the oxymorphone metabolite, levels of morphine were found to be above 3,497 ng/mg. This would suggest that the patient was actually consuming more morphine and oxycodone. It is very clear to me that this patient has a problem with opioid tolerance.  - Concomitant use of benzodiazepines: He is also using benzodiazepines in combination with the opioids. Today I have given him appropriate warnings as dictated by the CDC guidelines. - Unaccounted and unreported use of medication: The results of his UDS would indicate that he has been consuming morphine in rather large amounts. This morphine was not found as a prescribed medicine on his PMP.  Treatment plan offered: I did offer to take over his medications and slowly taper them down over a 22 week period until we could come off of them and accomplish a "drug holiday". Urine this time, the patient was also informed that we would conduct interventional therapies for the purpose of determining the exact location and etiology of his pain and to manage it in this manner while slowly decreasing the amount of opioids that he is taking, in accordance to CDC guidelines, for the purpose of decreasing his adverse event  risk. However, he was not interested in this and at this point, he terminated this visit and decided to leave. He also turned down any return appointments.  Topics covered today: the appropriate use of the pain scale, opioid tolerance, "Drug Holidays", Mr. Antenucci primary cause of pain, the results of his recent test(s), the significance of each one oth the test(s) anomalies and it's corresponding characteristic pain pattern(s), the  treatment plan, treatment alternatives, medication side effects, the opioid analgesic risks and possible complications, realistic expectations, the goals of pain management (increased in functionality) and the need to collect and read the AVS material.  Controlled Substance Pharmacotherapy Assessment REMS (Risk Evaluation and Mitigation Strategy)  Analgesic: Oxycontin 30 mg TID (90 mg/day) + Oxycodone IR 5 mg q6 hrs (20 mg/day). (110 mg/day of Oxycodone) Highest recorded MME/day: 165 mg/day MME/day:  > 165 mg/day Pill Count: None expected due to no prior prescriptions written by our practice. Hart Rochester, RN  11/08/2017  1:29 PM  Sign at close encounter Safety precautions to be maintained throughout the outpatient stay will include: orient to surroundings, keep bed in low position, maintain call bell within reach at all times, provide assistance with transfer out of bed and ambulation.    Pharmacokinetics: Liberation and absorption (onset of action): WNL Distribution (time to peak effect): WNL Metabolism and excretion (duration of action): WNL         Pharmacodynamics: Desired effects: Analgesia: Mr. Mcree reports >50% benefit. Functional ability: Patient reports that medication allows him to accomplish basic ADLs Clinically meaningful improvement in function (CMIF): Sustained CMIF goals met Perceived effectiveness: Described as relatively effective, allowing for increase in activities of daily living (ADL) Undesirable effects: Side-effects or Adverse reactions: None reported. However, review of the medical records would indicate that the patient has endocrinopathies associated with the long-term use of opioid analgesics. Monitoring: [place] PMP: Online review of the past 24-monthperiod previously conducted. According to his most recent PMP, he last received oxycodone/APAP 5/325 #12 on 08/17/2017. The last time that he received any OxyContin 30 mg tablets was on 07/14/2017 #90. However  he comes into the clinics today with bottles that seemed to be more recently filled and still having medications. A multistage PMP search was conducted and I was unable to locate any legal sources of morphine for this patient. This would suggest that the patient is obtaining the morphine in an illicit manner. List of other Serum/Urine Drug Screening Test(s):  No results found for: AMPHSCRSER, BARBSCRSER, BENZOSCRSER, COCAINSCRSER, COCAINSCRNUR, PCPSCRSER, THCSCRSER, THCU, CANNABQUANT, [place] [facility] [place] E[place] of all UDS test(s) done:  Lab Results  Component Value Date   SUMMARY FINAL 10/06/2017   Last UDS on record: Summary  Date Value Ref Range Status  10/06/2017 FINAL  Final    Comment:    ==================================================================== TOXASSURE COMP DRUG ANALYSIS,UR ==================================================================== Test                             Result       Flag       Units Drug Present and Declared for Prescription Verification   Oxymorphone                    2105         EXPECTED   ng/mg creat   Noroxycodone                   1281  EXPECTED   ng/mg creat   Noroxymorphone                 419          EXPECTED   ng/mg creat    Oxymorphone, noroxycodone and noroxymorphone are expected    metabolites of oxycodone. Noroxymorphone is an expected    metabolite of oxymorphone. Sources of oxycodone and/or    oxymorphone include scheduled prescription medications.   Gabapentin                     PRESENT      EXPECTED   Quetiapine                     PRESENT      EXPECTED Drug Present not Declared for Prescription Verification   Alprazolam                     18           UNEXPECTED ng/mg creat   Alpha-hydroxyalprazolam        223          UNEXPECTED ng/mg creat    Source of alprazolam is a scheduled prescription medication.    Alpha-hydroxyalprazolam is an expected metabolite of alprazolam.   Lorazepam                       492          UNEXPECTED ng/mg creat    Source of lorazepam is a scheduled prescription medication.   Morphine                       >3497        UNEXPECTED ng/mg creat   Normorphine                    2005         UNEXPECTED ng/mg creat    Potential sources of large amounts of morphine in the absence of    codeine include administration of morphine or use of heroin.    Normorphine is an expected metabolite of morphine.   Hydrocodone                    274          UNEXPECTED ng/mg creat   Hydromorphone                  385          UNEXPECTED ng/mg creat   Dihydrocodeine                 52           UNEXPECTED ng/mg creat   Norhydrocodone                 1275         UNEXPECTED ng/mg creat    Sources of hydrocodone include scheduled prescription    medications. Hydromorphone, dihydrocodeine and norhydrocodone are    expected metabolites of hydrocodone. Hydromorphone and    dihydrocodeine are also available as scheduled prescription    medications.   Acetaminophen                  PRESENT      UNEXPECTED Drug Absent but Declared for Prescription Verification   Oxycodone  Not Detected UNEXPECTED ng/mg creat    Oxycodone is almost always present in patients taking this drug    consistently.  Absence of oxycodone could be due to lapse of time    since the last dose or unusual pharmacokinetics (rapid    metabolism). ==================================================================== Test                      Result    Flag   Units      Ref Range   Creatinine              286              mg/dL      >=20 ==================================================================== Declared Medications:  The flagging and interpretation on this report are based on the  following declared medications.  Unexpected results may arise from  inaccuracies in the declared medications.  **Note: The testing scope of this panel includes these medications:  Gabapentin  Oxycodone   Oxycodone (Oxy IR)  Oxycodone (Roxicodone)  Quetiapine (Seroquel)  **Note: The testing scope of this panel does not include following  reported medications:  Multivitamin  Naloxone (Narcan)  Ondansetron (Zofran)  Sumatriptan (Imitrex)  Vitamin B ==================================================================== For clinical consultation, please call [phone]. ====================================================================    UDS interpretation: Unexpected findings: No sources of morphine located on a multistage PMP search suggesting that the source is illicit. Medication Assessment Form: Not applicable. Treatment compliance: Non-compliant Risk Assessment Profile: Aberrant behavior: attempts to get scripts from other providers, claims that "nothing else works", diminished ability to recognize a problem with one's behavior or use of the medication, drug seeking behavior, extensive time discussing medicaiton, hording of medications, inability to consider abstinence, lost, stolen or damaged medications, obtaining controlled substances from inappropriate sources, obtaining medications from illicit sources, resistance to changing therapy and significant opioid tolerance Comorbid factors increasing risk of overdose: Benzodiazepine use, concomitant use of Benzodiazepines, COPD or asthma, high daily dosage , history of multiple prescribers and male gender Medical Psychology Evaluation: Possible manipulation suspected. Opioid Risk Tool - 10/06/17 0819      Family History of Substance Abuse   Alcohol  Negative    Illegal Drugs  Negative    Rx Drugs  Negative      Personal History of Substance Abuse   Alcohol  Negative    Illegal Drugs  Negative    Rx Drugs  Negative      Psychological Disease   Psychological Disease  Negative    Depression  Negative      Total Score   Opioid Risk Tool Scoring  0    Opioid Risk Interpretation  Low Risk      Note: This patient has undergone  several medical psychology evaluations for substance use disorder over the years, suggesting that he clearly understands the system than the purpose of the testing. This could also open the possibility that the patient could be manipulating the results in order to come out as a "low risk". Unreliable ORT result.  ORT Scoring interpretation table:  Score <3 = Low Risk for SUD  Score between 4-7 = Moderate Risk for SUD  Score >8 = High Risk for Opioid Abuse   Risk Mitigation Strategies:  Patient opioid safety counseling: Opioid therapy will not be included in the treatment plan. Patient-Prescriber Agreement (PPA): No agreement signed.  Controlled substance notification to other providers: Not applicable  Pharmacologic Plan: Even though this patient does have medical indications for the possible use of  opioid analgesics, there appears to be a significant component of substance use disorder. The abnormal UDS, and possible endocrinopathies observed as a consequence of the use of these controlled substances would argue against simply continuing with his current regimen. I did offer to take over his medications and to taper him down to accomplish a "drug holiday", so as to diminish or eliminate his opioid tolerance. However, he was not interested in this and he was also not interested in anything else that I could offer him manage his pain. During today's visit it was very clear that his primary goal was to secure a source of opioids. Unfortunately I cannot help this patient with that.             Laboratory Chemistry  Inflammation Markers (CRP: Acute Phase) (ESR: Chronic Phase) Lab Results  Component Value Date   CRP 14.3 (H) 10/06/2017   ESRSEDRATE 36 (H) 10/06/2017  Result interpreted for patient during visit.   Rheumatology Markers Lab Results  Component Value Date   ANA Negative 08/20/2017  No significant anomalies detected.  Renal Function Markers Lab Results  Component Value Date    BUN 10 10/06/2017   CREATININE 0.82 10/06/2017   GFRAA 111 10/06/2017   GFRNONAA 96 10/06/2017  No significant anomalies detected.   Hepatic Function Markers Lab Results  Component Value Date   AST 21 10/06/2017   ALT 26 08/20/2017   ALBUMIN 4.0 10/06/2017   ALKPHOS 52 10/06/2017   HCVAB NEGATIVE 04/12/202017  No significant anomalies detected.   Electrolytes Lab Results  Component Value Date   NA 142 10/06/2017   K 4.3 10/06/2017   CL 104 10/06/2017   CALCIUM 9.5 10/06/2017   MG 2.0 10/06/2017  No significant anomalies detected.   Neuropathy Markers Lab Results  Component Value Date   LFYBOFBP10 258 10/06/2017   HIV NONREACTIVE 04/12/202017  No significant anomalies detected.   Bone Pathology Markers Lab Results  Component Value Date   25OHVITD1 41 10/06/2017   25OHVITD2 <1.0 10/06/2017   25OHVITD3 40 10/06/2017  No significant anomalies detected.   Coagulation Parameters Lab Results  Component Value Date   PLT 217 08/20/2017  No significant anomalies detected.   Cardiovascular Markers Lab Results  Component Value Date   HGB 13.9 08/20/2017   HCT 39.4 (L) 08/20/2017  Low hematocrit with normal hemoglobin levels, possibly suggesting dilution due to adequate or above adequate hydration.   CA Markers No results found for: CEA, CA125, LABCA2               Note: Lab results reviewed and explained to patient in Layman's terms.  Recent Diagnostic Imaging Review  Cervical Imaging: Cervical MR wo contrast:  Results for orders placed during the hospital encounter of 09/05/16  MR Cervical Spine Wo Contrast   Narrative CLINICAL DATA:  Neck pain radiating to bilateral upper extremities with numbness for 2 months. Two prior neck surgeries.  EXAM: MRI CERVICAL SPINE WITHOUT CONTRAST  TECHNIQUE: Multiplanar, multisequence MR imaging of the cervical spine was performed. No intravenous contrast was administered.  COMPARISON:  CT cervical spine July 27, 2016  FINDINGS: Severe mid cervical susceptibility artifact attributed to retained screw fragments LEFT C5 ventral vertebral body present on prior CT cervical spine.  ALIGNMENT: Straightened cervical lordosis. No definite malalignment.  VERTEBRAE/DISCS: C5-6 and C6-7 solid interbody fusion better seen on prior CT. C4-5 disc space obscured by susceptibility artifact. Severe T1-2 disc height loss, mild at C7-T1 with moderate acute on chronic discogenic  endplate changes J1-9, no suspicious bone marrow signal.  CORD:Mid cervical spinal cord obscured by susceptibility artifact. No syrinx.  POSTERIOR FOSSA, VERTEBRAL ARTERIES, PARASPINAL TISSUES: Limited by susceptibility artifact. No MR findings of ligamentous injury. Vertebral artery flow voids present. Included posterior fossa and paraspinal soft tissues are normal.  DISC LEVELS:  C2-3: Small to moderate broad-based disc bulge. Severe LEFT facet arthropathy. Uncovertebral hypertrophy. Mild canal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

## 2017-11-08 ENCOUNTER — Ambulatory Visit: Payer: PPO | Attending: Pain Medicine | Admitting: Pain Medicine

## 2017-11-08 ENCOUNTER — Encounter: Payer: Self-pay | Admitting: Pain Medicine

## 2017-11-08 VITALS — BP 110/71 | HR 77 | Temp 98.2°F | Resp 16 | Ht 71.0 in | Wt 185.0 lb

## 2017-11-08 DIAGNOSIS — G4700 Insomnia, unspecified: Secondary | ICD-10-CM | POA: Diagnosis not present

## 2017-11-08 DIAGNOSIS — Z86711 Personal history of pulmonary embolism: Secondary | ICD-10-CM | POA: Insufficient documentation

## 2017-11-08 DIAGNOSIS — N400 Enlarged prostate without lower urinary tract symptoms: Secondary | ICD-10-CM | POA: Diagnosis not present

## 2017-11-08 DIAGNOSIS — G894 Chronic pain syndrome: Secondary | ICD-10-CM | POA: Insufficient documentation

## 2017-11-08 DIAGNOSIS — Z818 Family history of other mental and behavioral disorders: Secondary | ICD-10-CM | POA: Insufficient documentation

## 2017-11-08 DIAGNOSIS — E1165 Type 2 diabetes mellitus with hyperglycemia: Secondary | ICD-10-CM | POA: Insufficient documentation

## 2017-11-08 DIAGNOSIS — Z79899 Other long term (current) drug therapy: Secondary | ICD-10-CM | POA: Diagnosis not present

## 2017-11-08 DIAGNOSIS — G8929 Other chronic pain: Secondary | ICD-10-CM | POA: Diagnosis not present

## 2017-11-08 DIAGNOSIS — E1140 Type 2 diabetes mellitus with diabetic neuropathy, unspecified: Secondary | ICD-10-CM | POA: Insufficient documentation

## 2017-11-08 DIAGNOSIS — M5441 Lumbago with sciatica, right side: Secondary | ICD-10-CM | POA: Diagnosis not present

## 2017-11-08 DIAGNOSIS — M961 Postlaminectomy syndrome, not elsewhere classified: Secondary | ICD-10-CM

## 2017-11-08 DIAGNOSIS — M4802 Spinal stenosis, cervical region: Secondary | ICD-10-CM | POA: Insufficient documentation

## 2017-11-08 DIAGNOSIS — M79605 Pain in left leg: Secondary | ICD-10-CM | POA: Diagnosis not present

## 2017-11-08 DIAGNOSIS — M87051 Idiopathic aseptic necrosis of right femur: Secondary | ICD-10-CM

## 2017-11-08 DIAGNOSIS — M25551 Pain in right hip: Secondary | ICD-10-CM | POA: Insufficient documentation

## 2017-11-08 DIAGNOSIS — F17210 Nicotine dependence, cigarettes, uncomplicated: Secondary | ICD-10-CM | POA: Diagnosis not present

## 2017-11-08 DIAGNOSIS — M79604 Pain in right leg: Secondary | ICD-10-CM

## 2017-11-08 DIAGNOSIS — M25561 Pain in right knee: Secondary | ICD-10-CM | POA: Insufficient documentation

## 2017-11-08 DIAGNOSIS — M5106 Intervertebral disc disorders with myelopathy, lumbar region: Secondary | ICD-10-CM | POA: Diagnosis not present

## 2017-11-08 DIAGNOSIS — F329 Major depressive disorder, single episode, unspecified: Secondary | ICD-10-CM | POA: Insufficient documentation

## 2017-11-08 DIAGNOSIS — Z79891 Long term (current) use of opiate analgesic: Secondary | ICD-10-CM | POA: Insufficient documentation

## 2017-11-08 DIAGNOSIS — G4486 Cervicogenic headache: Secondary | ICD-10-CM

## 2017-11-08 DIAGNOSIS — Z809 Family history of malignant neoplasm, unspecified: Secondary | ICD-10-CM | POA: Insufficient documentation

## 2017-11-08 DIAGNOSIS — M25562 Pain in left knee: Secondary | ICD-10-CM | POA: Diagnosis not present

## 2017-11-08 DIAGNOSIS — N6320 Unspecified lump in the left breast, unspecified quadrant: Secondary | ICD-10-CM | POA: Insufficient documentation

## 2017-11-08 DIAGNOSIS — H579 Unspecified disorder of eye and adnexa: Secondary | ICD-10-CM | POA: Insufficient documentation

## 2017-11-08 DIAGNOSIS — Q792 Exomphalos: Secondary | ICD-10-CM | POA: Diagnosis not present

## 2017-11-08 DIAGNOSIS — R51 Headache: Secondary | ICD-10-CM | POA: Insufficient documentation

## 2017-11-08 DIAGNOSIS — M5442 Lumbago with sciatica, left side: Secondary | ICD-10-CM | POA: Diagnosis not present

## 2017-11-08 DIAGNOSIS — M25552 Pain in left hip: Secondary | ICD-10-CM | POA: Insufficient documentation

## 2017-11-08 DIAGNOSIS — Z9889 Other specified postprocedural states: Secondary | ICD-10-CM | POA: Insufficient documentation

## 2017-11-08 DIAGNOSIS — M87052 Idiopathic aseptic necrosis of left femur: Secondary | ICD-10-CM

## 2017-11-08 NOTE — Progress Notes (Signed)
Safety precautions to be maintained throughout the outpatient stay will include: orient to surroundings, keep bed in low position, maintain call bell within reach at all times, provide assistance with transfer out of bed and ambulation.  

## 2017-11-09 ENCOUNTER — Encounter: Payer: Self-pay | Admitting: Pain Medicine

## 2017-11-10 DIAGNOSIS — R531 Weakness: Secondary | ICD-10-CM | POA: Diagnosis not present

## 2017-11-10 DIAGNOSIS — H5712 Ocular pain, left eye: Secondary | ICD-10-CM | POA: Diagnosis not present

## 2017-11-10 DIAGNOSIS — R208 Other disturbances of skin sensation: Secondary | ICD-10-CM | POA: Insufficient documentation

## 2017-11-10 DIAGNOSIS — M87052 Idiopathic aseptic necrosis of left femur: Secondary | ICD-10-CM

## 2017-11-10 DIAGNOSIS — R259 Unspecified abnormal involuntary movements: Secondary | ICD-10-CM | POA: Diagnosis not present

## 2017-11-10 DIAGNOSIS — R200 Anesthesia of skin: Secondary | ICD-10-CM | POA: Insufficient documentation

## 2017-11-10 DIAGNOSIS — M87051 Idiopathic aseptic necrosis of right femur: Secondary | ICD-10-CM | POA: Insufficient documentation

## 2017-11-10 DIAGNOSIS — G4486 Cervicogenic headache: Secondary | ICD-10-CM | POA: Insufficient documentation

## 2017-11-10 DIAGNOSIS — R51 Headache: Secondary | ICD-10-CM

## 2017-11-30 DIAGNOSIS — G629 Polyneuropathy, unspecified: Secondary | ICD-10-CM | POA: Diagnosis not present

## 2017-12-08 DIAGNOSIS — A419 Sepsis, unspecified organism: Secondary | ICD-10-CM | POA: Diagnosis not present

## 2017-12-08 DIAGNOSIS — F17200 Nicotine dependence, unspecified, uncomplicated: Secondary | ICD-10-CM | POA: Diagnosis not present

## 2017-12-08 DIAGNOSIS — G894 Chronic pain syndrome: Secondary | ICD-10-CM | POA: Diagnosis not present

## 2017-12-08 DIAGNOSIS — M545 Low back pain: Secondary | ICD-10-CM | POA: Diagnosis not present

## 2017-12-08 DIAGNOSIS — F329 Major depressive disorder, single episode, unspecified: Secondary | ICD-10-CM | POA: Diagnosis not present

## 2017-12-08 DIAGNOSIS — J189 Pneumonia, unspecified organism: Secondary | ICD-10-CM | POA: Diagnosis not present

## 2017-12-08 DIAGNOSIS — M542 Cervicalgia: Secondary | ICD-10-CM | POA: Diagnosis not present

## 2017-12-08 DIAGNOSIS — Z885 Allergy status to narcotic agent status: Secondary | ICD-10-CM | POA: Diagnosis not present

## 2017-12-08 DIAGNOSIS — R05 Cough: Secondary | ICD-10-CM | POA: Diagnosis not present

## 2017-12-10 DIAGNOSIS — J189 Pneumonia, unspecified organism: Secondary | ICD-10-CM | POA: Diagnosis not present

## 2017-12-10 DIAGNOSIS — G894 Chronic pain syndrome: Secondary | ICD-10-CM | POA: Diagnosis not present

## 2017-12-13 ENCOUNTER — Other Ambulatory Visit: Payer: Self-pay | Admitting: *Deleted

## 2017-12-13 DIAGNOSIS — J189 Pneumonia, unspecified organism: Secondary | ICD-10-CM | POA: Diagnosis not present

## 2017-12-13 DIAGNOSIS — G894 Chronic pain syndrome: Secondary | ICD-10-CM | POA: Diagnosis not present

## 2017-12-13 NOTE — Patient Outreach (Signed)
[facility]) Care Management  12/13/2017  Gregory Cervantes 1956-03-26 [id number]  Referral from [place]; patient discharged from [place]. [facility] (hospital in [place], [place]) 12/09/2017:  Telephone call to patient; left call back number.   Plan: Will follow up.  Sherrin Daisy, RN BSN [place] Management Coordinator [facility] Care Management  [phone]

## 2017-12-15 ENCOUNTER — Other Ambulatory Visit: Payer: Self-pay | Admitting: *Deleted

## 2017-12-15 NOTE — Patient Outreach (Signed)
[facility]) Care Management  12/15/2017  ELDWIN VOLKOV 1956-02-18 [id number]   Referral from [place]; patient discharged from [place]. [facility] (hospital in [place], [place]) 12/09/2017:  Telephone call to patient who was advised of reason for call. HIPPA verification received. Voices he is currently in [place]. Plans to go to hospital follow up appointment tomorrow in [place] with primary care provider.  States he lives part of the year in [place] & part in [place].  States he also has primary care provider in [place], [place]. .States he is unable to talk now but interested in services.  Advised unable to offer services now because he is out of state. Patient voices that he will be in [place] next week.    Plan: Will follow up. Discuss case with supervisor.  Sherrin Daisy, RN BSN [place] Management Coordinator [facility] Care Management  [phone]

## 2017-12-16 DIAGNOSIS — G894 Chronic pain syndrome: Secondary | ICD-10-CM | POA: Diagnosis not present

## 2017-12-16 DIAGNOSIS — R309 Painful micturition, unspecified: Secondary | ICD-10-CM | POA: Diagnosis not present

## 2017-12-22 ENCOUNTER — Other Ambulatory Visit: Payer: Self-pay | Admitting: *Deleted

## 2017-12-22 NOTE — Patient Outreach (Signed)
[facility]) Care Management  12/22/2017  Gregory Cervantes Apr 13, 1956 [id number]  Patient is not eligible for [facility] services if he is currently living in [place] and following up with provider in [place].  Telephone call to patient; no answer; unable to leave message  Plan: Send to care management assistant to close case.  Sherrin Daisy, RN BSN [place] Management Coordinator [facility] Care Management  [phone]

## 2017-12-29 DIAGNOSIS — G894 Chronic pain syndrome: Secondary | ICD-10-CM | POA: Diagnosis not present

## 2017-12-29 DIAGNOSIS — R200 Anesthesia of skin: Secondary | ICD-10-CM | POA: Diagnosis not present

## 2017-12-29 DIAGNOSIS — Z79899 Other long term (current) drug therapy: Secondary | ICD-10-CM | POA: Diagnosis not present

## 2017-12-31 ENCOUNTER — Other Ambulatory Visit: Payer: Self-pay

## 2018-01-10 DIAGNOSIS — K047 Periapical abscess without sinus: Secondary | ICD-10-CM | POA: Diagnosis not present

## 2018-01-28 ENCOUNTER — Other Ambulatory Visit: Payer: Self-pay

## 2018-02-03 DIAGNOSIS — F39 Unspecified mood [affective] disorder: Secondary | ICD-10-CM | POA: Diagnosis not present

## 2018-02-15 DIAGNOSIS — F17210 Nicotine dependence, cigarettes, uncomplicated: Secondary | ICD-10-CM | POA: Diagnosis not present

## 2018-02-15 DIAGNOSIS — G63 Polyneuropathy in diseases classified elsewhere: Secondary | ICD-10-CM | POA: Diagnosis not present

## 2018-02-15 DIAGNOSIS — R5382 Chronic fatigue, unspecified: Secondary | ICD-10-CM | POA: Diagnosis not present

## 2018-02-15 DIAGNOSIS — R309 Painful micturition, unspecified: Secondary | ICD-10-CM | POA: Diagnosis not present

## 2018-02-22 DIAGNOSIS — R531 Weakness: Secondary | ICD-10-CM | POA: Diagnosis not present

## 2018-02-22 DIAGNOSIS — G44029 Chronic cluster headache, not intractable: Secondary | ICD-10-CM | POA: Diagnosis not present

## 2018-02-22 DIAGNOSIS — H5712 Ocular pain, left eye: Secondary | ICD-10-CM | POA: Diagnosis not present

## 2018-02-22 DIAGNOSIS — R200 Anesthesia of skin: Secondary | ICD-10-CM | POA: Diagnosis not present

## 2018-02-22 DIAGNOSIS — R208 Other disturbances of skin sensation: Secondary | ICD-10-CM | POA: Diagnosis not present

## 2018-03-29 DIAGNOSIS — R5383 Other fatigue: Secondary | ICD-10-CM | POA: Diagnosis not present

## 2018-03-29 DIAGNOSIS — R110 Nausea: Secondary | ICD-10-CM | POA: Diagnosis not present

## 2018-03-29 DIAGNOSIS — R634 Abnormal weight loss: Secondary | ICD-10-CM | POA: Diagnosis not present

## 2018-03-29 DIAGNOSIS — R590 Localized enlarged lymph nodes: Secondary | ICD-10-CM | POA: Diagnosis not present

## 2018-05-05 DIAGNOSIS — M87051 Idiopathic aseptic necrosis of right femur: Secondary | ICD-10-CM | POA: Diagnosis not present

## 2018-05-05 DIAGNOSIS — M160 Bilateral primary osteoarthritis of hip: Secondary | ICD-10-CM | POA: Diagnosis not present

## 2018-05-05 DIAGNOSIS — R5382 Chronic fatigue, unspecified: Secondary | ICD-10-CM | POA: Diagnosis not present

## 2018-05-05 DIAGNOSIS — M2550 Pain in unspecified joint: Secondary | ICD-10-CM | POA: Diagnosis not present

## 2018-05-05 DIAGNOSIS — R911 Solitary pulmonary nodule: Secondary | ICD-10-CM | POA: Insufficient documentation

## 2018-05-05 DIAGNOSIS — R768 Other specified abnormal immunological findings in serum: Secondary | ICD-10-CM | POA: Diagnosis not present

## 2018-05-05 DIAGNOSIS — M87052 Idiopathic aseptic necrosis of left femur: Secondary | ICD-10-CM | POA: Diagnosis not present

## 2018-05-05 DIAGNOSIS — J9811 Atelectasis: Secondary | ICD-10-CM | POA: Diagnosis not present

## 2018-05-18 DIAGNOSIS — G894 Chronic pain syndrome: Secondary | ICD-10-CM | POA: Diagnosis not present

## 2018-05-18 DIAGNOSIS — R5382 Chronic fatigue, unspecified: Secondary | ICD-10-CM | POA: Diagnosis not present

## 2018-05-18 DIAGNOSIS — M47816 Spondylosis without myelopathy or radiculopathy, lumbar region: Secondary | ICD-10-CM | POA: Diagnosis not present

## 2018-05-18 DIAGNOSIS — R1313 Dysphagia, pharyngeal phase: Secondary | ICD-10-CM | POA: Diagnosis not present

## 2018-05-18 DIAGNOSIS — M87051 Idiopathic aseptic necrosis of right femur: Secondary | ICD-10-CM | POA: Diagnosis not present

## 2018-05-18 DIAGNOSIS — M87052 Idiopathic aseptic necrosis of left femur: Secondary | ICD-10-CM | POA: Diagnosis not present

## 2018-05-18 DIAGNOSIS — R768 Other specified abnormal immunological findings in serum: Secondary | ICD-10-CM | POA: Diagnosis not present

## 2018-05-20 DIAGNOSIS — M25552 Pain in left hip: Secondary | ICD-10-CM | POA: Diagnosis not present

## 2018-05-20 DIAGNOSIS — M25551 Pain in right hip: Secondary | ICD-10-CM | POA: Diagnosis not present

## 2018-05-24 DIAGNOSIS — R6881 Early satiety: Secondary | ICD-10-CM | POA: Diagnosis not present

## 2018-05-24 DIAGNOSIS — R110 Nausea: Secondary | ICD-10-CM | POA: Diagnosis not present

## 2018-05-24 DIAGNOSIS — R140 Abdominal distension (gaseous): Secondary | ICD-10-CM | POA: Diagnosis not present

## 2018-05-24 DIAGNOSIS — R1084 Generalized abdominal pain: Secondary | ICD-10-CM | POA: Diagnosis not present

## 2018-06-07 DIAGNOSIS — G44019 Episodic cluster headache, not intractable: Secondary | ICD-10-CM | POA: Diagnosis not present

## 2018-06-15 ENCOUNTER — Other Ambulatory Visit: Payer: Self-pay

## 2018-06-15 ENCOUNTER — Ambulatory Visit: Payer: PPO | Admitting: Certified Registered Nurse Anesthetist

## 2018-06-15 ENCOUNTER — Ambulatory Visit
Admission: RE | Admit: 2018-06-15 | Discharge: 2018-06-15 | Disposition: A | Discharge status: Home / Self Care | Payer: PPO | Source: Ambulatory Visit | Attending: Internal Medicine | Admitting: Internal Medicine

## 2018-06-15 ENCOUNTER — Encounter: Payer: Self-pay | Admitting: *Deleted

## 2018-06-15 ENCOUNTER — Encounter
Admission: RE | Disposition: A | Discharge status: Home / Self Care | Payer: Self-pay | Source: Ambulatory Visit | Attending: Internal Medicine

## 2018-06-15 DIAGNOSIS — R1310 Dysphagia, unspecified: Secondary | ICD-10-CM | POA: Diagnosis not present

## 2018-06-15 DIAGNOSIS — K228 Other specified diseases of esophagus: Secondary | ICD-10-CM | POA: Diagnosis not present

## 2018-06-15 DIAGNOSIS — K2970 Gastritis, unspecified, without bleeding: Secondary | ICD-10-CM | POA: Insufficient documentation

## 2018-06-15 DIAGNOSIS — K3189 Other diseases of stomach and duodenum: Secondary | ICD-10-CM | POA: Diagnosis not present

## 2018-06-15 DIAGNOSIS — K2960 Other gastritis without bleeding: Secondary | ICD-10-CM | POA: Diagnosis not present

## 2018-06-15 DIAGNOSIS — R6881 Early satiety: Secondary | ICD-10-CM | POA: Diagnosis present

## 2018-06-15 DIAGNOSIS — K2950 Unspecified chronic gastritis without bleeding: Secondary | ICD-10-CM | POA: Diagnosis not present

## 2018-06-15 HISTORY — PX: ESOPHAGOGASTRODUODENOSCOPY (EGD) WITH PROPOFOL: SHX5813

## 2018-06-15 SURGERY — ESOPHAGOGASTRODUODENOSCOPY (EGD) WITH PROPOFOL
Anesthesia: General

## 2018-06-15 MED ORDER — PROPOFOL 500 MG/50ML IV EMUL
INTRAVENOUS | Status: DC | PRN
  Administered 2018-06-15: 125 ug/kg/min via INTRAVENOUS

## 2018-06-15 MED ORDER — PROPOFOL 10 MG/ML IV BOLUS
INTRAVENOUS | Status: DC | PRN
  Administered 2018-06-15: 50 mg via INTRAVENOUS

## 2018-06-15 MED ORDER — PROPOFOL 500 MG/50ML IV EMUL
INTRAVENOUS | Status: AC
  Filled 2018-06-15: qty 50

## 2018-06-15 MED ORDER — PHENYLEPHRINE HCL 10 MG/ML IJ SOLN
INTRAMUSCULAR | Status: DC | PRN
  Administered 2018-06-15: 100 ug via INTRAVENOUS

## 2018-06-15 MED ORDER — SODIUM CHLORIDE 0.9 % IV SOLN
INTRAVENOUS | Status: DC
  Administered 2018-06-15 (×3): via INTRAVENOUS

## 2018-06-15 MED ORDER — PROPOFOL 10 MG/ML IV BOLUS
INTRAVENOUS | Status: AC
Start: 2018-06-15 — End: ?
  Filled 2018-06-15: qty 20

## 2018-06-15 MED ORDER — LIDOCAINE HCL (CARDIAC) PF 100 MG/5ML IV SOSY
PREFILLED_SYRINGE | INTRAVENOUS | Status: DC | PRN
  Administered 2018-06-15: 50 mg via INTRAVENOUS

## 2018-06-15 NOTE — Op Note (Signed)
[facility] Gastroenterology Patient Name: Gregory Cervantes Procedure Date: 06/15/2018 1:16 PM MRN: [id number] Account #: [id number] Date of Birth: 08-Jan-1956 Admit Type: Outpatient Age: 62 Room: [facility] ENDO ROOM 3 Gender: Male Note Status: Finalized Procedure:            Upper GI endoscopy Indications:          Epigastric abdominal pain, Dysphagia, Early satiety Providers:            Benay Pike. Alice Reichert MD, MD Referring MD:         Wynona Canes. [name], MD (Referring MD) Medicines:            Propofol per Anesthesia Complications:        No immediate complications. Procedure:            Pre-Anesthesia Assessment:                       - The risks and benefits of the procedure and the                        sedation options and risks were discussed with the                        patient. All questions were answered and informed                        consent was obtained.                       - Patient identification and proposed procedure were                        verified prior to the procedure by the nurse. The                        procedure was verified in the procedure room.                       - ASA Grade Assessment: II - A patient with mild                        systemic disease.                       - After reviewing the risks and benefits, the patient                        was deemed in satisfactory condition to undergo the                        procedure.                       After obtaining informed consent, the endoscope was                        passed under direct vision. Throughout the procedure,                        the patient's blood pressure, pulse, and oxygen  saturations were monitored continuously. The Endoscope                        was introduced through the mouth, and advanced to the                        third part of duodenum. The upper GI endoscopy was                        accomplished without difficulty.  The patient tolerated                        the procedure well. Findings:      The Z-line was irregular and was found at the gastroesophageal junction.       Mucosa was biopsied with a cold forceps for histology. One specimen       bottle was sent to pathology.      Localized mildly erythematous mucosa without bleeding was found in the       gastric antrum. Biopsies were taken with a cold forceps for Helicobacter       pylori testing. Biopsies were taken with a cold forceps for Helicobacter       pylori testing.      The examined duodenum was normal.      The exam was otherwise without abnormality. Impression:           - Z-line irregular, at the gastroesophageal junction.                        Biopsied.                       - Erythematous mucosa in the antrum. Biopsied.                       - Normal examined duodenum.                       - The examination was otherwise normal. Recommendation:       - Patient has a contact number available for                        emergencies. The signs and symptoms of potential                        delayed complications were discussed with the patient.                        Return to normal activities tomorrow. Written discharge                        instructions were provided to the patient.                       - Resume previous diet.                       - Continue present medications.                       - Await pathology results.                       -  Return to physician assistant in 6 weeks.                       - The findings and recommendations were discussed with                        the patient and their family. Procedure Code(s):    --- Professional ---                       [phone], Esophagogastroduodenoscopy, flexible, transoral;                        with biopsy, single or multiple Diagnosis Code(s):    --- Professional ---                       R68.81, Early satiety                       R13.10, Dysphagia,  unspecified                       R10.13, Epigastric pain                       K31.89, Other diseases of stomach and duodenum                       K22.8, Other specified diseases of esophagus CPT copyright 2017 [employer]. All rights reserved. The codes documented in this report are preliminary and upon coder review may  be revised to meet current compliance requirements. Efrain Sella MD, MD 06/15/2018 1:37:22 PM This report has been signed electronically. Number of Addenda: 0 Note Initiated On: 06/15/2018 1:16 PM      [facility]

## 2018-06-15 NOTE — H&P (Signed)
Outpatient short stay form Pre-procedure 06/15/2018 1:19 PM Gregory Cervantes K. Gregory Cervantes, M.D.  Primary Physician: Johny Drilling, M.D.  Reason for visit:  Dysphagia, epigastric pain, early satiety  History of present illness: 62 y/o male with early satiety and possible slight component of esophageal dysphagia, mild. Mild epigastric fullness, 'discomfort' but 'not pain' per se.     Current Facility-Administered Medications:  .  0.9 %  sodium chloride infusion, , Intravenous, Continuous, [place], Benay Pike, MD, Last Rate: 20 mL/hr at 06/15/18 1251  Medications Prior to Admission  Medication Sig Dispense Refill Last Dose  . B Complex Vitamins (VITAMIN-B COMPLEX) TABS Take by mouth.   Past Week at Unknown time  . gabapentin (NEURONTIN) 400 MG capsule Take 400 mg by mouth 5 (five) times daily as needed (for pain.).    06/14/2018 at 2000  . Multiple Vitamin (MULTIVITAMIN WITH MINERALS) TABS tablet Take 1 tablet by mouth daily.   Past Week at Unknown time  . naloxone (NARCAN) 0.4 MG/ML injection Inject 0.4 mg into the muscle every 2 (two) hours as needed (for opoid overuse.).    Past Month at Unknown time  . ondansetron (ZOFRAN-ODT) 8 MG disintegrating tablet TAKE 1 TABLET (8 MG TOTAL) BY MOUTH EVERY 8 (EIGHT) HOURS AS NEEDED FOR NAUSEA FOR UP TO 10 DAYS.  0 Past Week at Unknown time  . oxyCODONE (OXY IR/ROXICODONE) 5 MG immediate release tablet TAKE 1 TABLET BY MOUTH EVERY 6 HOURS AS NEEDED FOR BREAKTHROUGH PAIN  0 06/15/2018 at 1200  . QUEtiapine (SEROQUEL) 100 MG tablet TAKE 1 TABLET (100 MG TOTAL) BY MOUTH AT BEDTIME. (Patient taking differently: Take 100 mg by mouth at bedtime. ) 30 tablet 1 06/14/2018 at 2200  . SUMAtriptan (IMITREX) 6 MG/0.5ML SOLN injection Inject 0.6 mg into the skin 2 (two) times daily as needed (*may repeat dose in 2 hrs--max 2/24hr*for migraine/cluster headaches.).    Past Week at Unknown time  . OxyCODONE HCl 30 MG TABA Take 30 mg by mouth 3 (three) times daily.   Taking     No Known  Allergies   Past Medical History:  Diagnosis Date  . Abnormal glucose 11/01/2013  . Arthritis   . BPH (benign prostatic hyperplasia)   . Chronic back pain   . Early satiety 11/18/2016  . Edema 03/28/2014  . Edema, pitting 03/28/2014  . Elevated WBC count 11/01/2013  . Esophageal candidiasis ([place]) 11/01/2013  . Headache   . History of pulmonary embolus (PE)   . Hyperglycemia, unspecified 11/01/2013  . Insomnia   . Loss of weight 11/18/2016  . Night sweat 08/19/2016  . Peripheral neuropathy   . Smoker 08/19/2016  . TB lung, latent 08/19/2016  . Weight gain 08/19/2016    Review of systems:  Otherwise negative.    Physical Exam  Gen: Alert, oriented. Appears stated age.  HEENT: [place]/AT. PERRLA. Lungs: CTA, no wheezes. CV: RR nl S1, S2. Abd: soft, benign, no masses. BS+ Ext: No edema. Pulses 2+    Planned procedures: Proceed with EGD. The patient understands the nature of the planned procedure, indications, risks, alternatives and potential complications including but not limited to bleeding, infection, perforation, damage to internal organs and possible oversedation/side effects from anesthesia. The patient agrees and gives consent to proceed.  Please refer to procedure notes for findings, recommendations and patient disposition/instructions.     Gregory Cervantes K. Gregory Cervantes, M.D. Gastroenterology 06/15/2018  1:19 PM

## 2018-06-15 NOTE — Anesthesia Post-op Follow-up Note (Signed)
Anesthesia QCDR form completed.        

## 2018-06-15 NOTE — Anesthesia Preprocedure Evaluation (Signed)
Anesthesia Evaluation  Patient identified by MRN, date of birth, ID band Patient awake    Reviewed: Allergy & Precautions, NPO status , Patient's Chart, lab work & pertinent test results  History of Anesthesia Complications Negative for: history of anesthetic complications  Airway Mallampati: II       Dental  (+) Missing, Chipped   Pulmonary neg sleep apnea, neg COPD, Current Smoker,           Cardiovascular (-) hypertension(-) Past MI and (-) CHF (-) dysrhythmias (-) Valvular Problems/Murmurs     Neuro/Psych neg Seizures Depression    GI/Hepatic Neg liver ROS, neg GERD  ,  Endo/Other  diabetes (bordelrine, while on prednisone)  Renal/GU negative Renal ROS     Musculoskeletal   Abdominal   Peds  Hematology   Anesthesia Other Findings   Reproductive/Obstetrics                             Anesthesia Physical Anesthesia Plan  ASA: II  Anesthesia Plan: General   Post-op Pain Management:    Induction: Intravenous  PONV Risk Score and Plan: 1  Airway Management Planned: Nasal Cannula  Additional Equipment:   Intra-op Plan:   Post-operative Plan:   Informed Consent: I have reviewed the patients History and Physical, chart, labs and discussed the procedure including the risks, benefits and alternatives for the proposed anesthesia with the patient or authorized representative who has indicated his/her understanding and acceptance.     Plan Discussed with:   Anesthesia Plan Comments:         Anesthesia Quick Evaluation

## 2018-06-15 NOTE — Anesthesia Postprocedure Evaluation (Signed)
Anesthesia Post Note  Patient: Gregory Cervantes  Procedure(s) Performed: ESOPHAGOGASTRODUODENOSCOPY (EGD) WITH PROPOFOL (N/A )  Patient location during evaluation: Endoscopy Anesthesia Type: General Level of consciousness: awake and alert Pain management: pain level controlled Vital Signs Assessment: post-procedure vital signs reviewed and stable Respiratory status: spontaneous breathing and respiratory function stable Cardiovascular status: stable Anesthetic complications: no     Last Vitals:  Vitals:   06/15/18 1234 06/15/18 1338  BP: 117/79 99/69  Pulse: 66   Resp: 16   Temp: (!) 36.2 C (!) 36.1 C  SpO2: 99%     Last Pain:  Vitals:   06/15/18 1358  TempSrc:   PainSc: 0-No pain                 KEPHART,WILLIAM K

## 2018-06-15 NOTE — Interval H&P Note (Signed)
History and Physical Interval Note:  06/15/2018 1:21 PM  Gregory Cervantes  has presented today for surgery, with the diagnosis of dysphagia  The various methods of treatment have been discussed with the patient and family. After consideration of risks, benefits and other options for treatment, the patient has consented to  Procedure(s): ESOPHAGOGASTRODUODENOSCOPY (EGD) WITH PROPOFOL (N/A) as a surgical intervention .  The patient's history has been reviewed, patient examined, no change in status, stable for surgery.  I have reviewed the patient's chart and labs.  Questions were answered to the patient's satisfaction.     [place], [place]

## 2018-06-15 NOTE — Transfer of Care (Signed)
Immediate Anesthesia Transfer of Care Note  Patient: Gregory Cervantes  Procedure(s) Performed: ESOPHAGOGASTRODUODENOSCOPY (EGD) WITH PROPOFOL (N/A )  Patient Location: PACU  Anesthesia Type:MAC  Level of Consciousness: awake, alert  and oriented  Airway & Oxygen Therapy: Patient Spontanous Breathing and Patient connected to nasal cannula oxygen  Post-op Assessment: Report given to RN and Post -op Vital signs reviewed and stable  Post vital signs: stable  Last Vitals:  Vitals Value Taken Time  BP    Temp    Pulse    Resp    SpO2      Last Pain:  Vitals:   06/15/18 1234  TempSrc: Tympanic  PainSc: 6          Complications: No apparent anesthesia complications

## 2018-06-17 LAB — SURGICAL PATHOLOGY

## 2018-06-20 ENCOUNTER — Encounter: Payer: Self-pay | Admitting: Internal Medicine

## 2018-06-29 DIAGNOSIS — R51 Headache: Secondary | ICD-10-CM | POA: Diagnosis not present

## 2018-06-29 DIAGNOSIS — H6981 Other specified disorders of Eustachian tube, right ear: Secondary | ICD-10-CM | POA: Diagnosis not present

## 2018-06-29 DIAGNOSIS — F5104 Psychophysiologic insomnia: Secondary | ICD-10-CM | POA: Diagnosis not present

## 2018-06-29 DIAGNOSIS — R110 Nausea: Secondary | ICD-10-CM | POA: Diagnosis not present

## 2018-07-17 ENCOUNTER — Encounter: Payer: Self-pay | Admitting: Emergency Medicine

## 2018-07-17 ENCOUNTER — Emergency Department
Admission: EM | Admit: 2018-07-17 | Discharge: 2018-07-17 | Disposition: A | Discharge status: Home / Self Care | Payer: PPO | Attending: Emergency Medicine | Admitting: Emergency Medicine

## 2018-07-17 ENCOUNTER — Other Ambulatory Visit: Payer: Self-pay

## 2018-07-17 DIAGNOSIS — Z79899 Other long term (current) drug therapy: Secondary | ICD-10-CM | POA: Diagnosis not present

## 2018-07-17 DIAGNOSIS — F329 Major depressive disorder, single episode, unspecified: Secondary | ICD-10-CM | POA: Diagnosis not present

## 2018-07-17 DIAGNOSIS — Y9389 Activity, other specified: Secondary | ICD-10-CM | POA: Diagnosis not present

## 2018-07-17 DIAGNOSIS — F17210 Nicotine dependence, cigarettes, uncomplicated: Secondary | ICD-10-CM | POA: Insufficient documentation

## 2018-07-17 DIAGNOSIS — S61422A Laceration with foreign body of left hand, initial encounter: Secondary | ICD-10-CM | POA: Diagnosis not present

## 2018-07-17 DIAGNOSIS — Z23 Encounter for immunization: Secondary | ICD-10-CM | POA: Diagnosis not present

## 2018-07-17 DIAGNOSIS — S61412A Laceration without foreign body of left hand, initial encounter: Secondary | ICD-10-CM | POA: Insufficient documentation

## 2018-07-17 DIAGNOSIS — Y998 Other external cause status: Secondary | ICD-10-CM | POA: Diagnosis not present

## 2018-07-17 DIAGNOSIS — W298XXA Contact with other powered powered hand tools and household machinery, initial encounter: Secondary | ICD-10-CM | POA: Insufficient documentation

## 2018-07-17 DIAGNOSIS — Y929 Unspecified place or not applicable: Secondary | ICD-10-CM | POA: Insufficient documentation

## 2018-07-17 MED ORDER — LIDOCAINE HCL (PF) 1 % IJ SOLN
5.0000 mL | Freq: Once | INTRAMUSCULAR | Status: AC
Start: 2018-07-17 — End: 2018-07-17
  Administered 2018-07-17: 5 mL
  Filled 2018-07-17: qty 5

## 2018-07-17 MED ORDER — BACITRACIN ZINC 500 UNIT/GM EX OINT
1.0000 "application " | TOPICAL_OINTMENT | Freq: Once | CUTANEOUS | Status: AC
  Administered 2018-07-17: 1 via TOPICAL
  Filled 2018-07-17: qty 0.9

## 2018-07-17 MED ORDER — TETANUS-DIPHTH-ACELL PERTUSSIS 5-2.5-18.5 LF-MCG/0.5 IM SUSP
0.5000 mL | Freq: Once | INTRAMUSCULAR | Status: AC
  Administered 2018-07-17: 0.5 mL via INTRAMUSCULAR
  Filled 2018-07-17: qty 0.5

## 2018-07-17 MED ORDER — CEPHALEXIN 500 MG PO CAPS: 500.0000 mg | ORAL_CAPSULE | Freq: Three times a day (TID) | ORAL | 0 refills | Status: DC

## 2018-07-17 NOTE — ED Notes (Addendum)
Laceration to left wrist area, states he cut it in a grinder this afternoon.  Pt has bandage in place at this time.   Unknown when last tetanus shot was.

## 2018-07-17 NOTE — Discharge Instructions (Addendum)
Follow-up with Dr. Jackqulyn Livings.  Please call on Monday for an appointment.  Tell them you are seen in the emergency department and have a laceration to the extensor tendon of your left hand.  Been given an antibiotic as the wound is very dirty.  Please pick this prescription up at the [facility] in [place].  Your hand is being placed in a splint so that the tendon can be repaired more easily.  You may take this off only to change your dressing.

## 2018-07-17 NOTE — ED Notes (Signed)
Dressing applied, then area splinted. Discussed discharge instructions with pt and instructed to call for appointment tomorrow to see hand specialist. Pt verbalized understanding of all DC instructions.

## 2018-07-17 NOTE — ED Triage Notes (Signed)
Pt presents to ED via POV c/o of laceration to left hand from grinder. Pt has limited movement of all fingers on left hand. NAD noted at this time. Pt also has previous hx of limited mobility in the left index finger due to different laceration.

## 2018-07-17 NOTE — ED Triage Notes (Signed)
First Nurse Note:  Cut right hand with grinder just PTA.  DSD placed on patient.  Patient unable to move index finger and thumb.

## 2018-07-17 NOTE — ED Provider Notes (Signed)
[facility] Emergency Department Provider Note  ____________________________________________   First MD Initiated Contact with Patient 07/17/18 1413     (approximate)  I have reviewed the triage vital signs and the nursing notes.   HISTORY  Chief Complaint Extremity Laceration    HPI Gregory Cervantes is a 62 y.o. male presents emergency department complaining of a laceration to the left hand.  He states he was using a grinder to work on metal for his car and ended up cutting his hand.  He is unsure of his last tetanus shot.  He states he is not able to move his index finger as normal.  He states he did have some trouble distally prior to the incident today but now he cannot move the entire finger.  He denies numbness or tingling.  Denies any other injuries.    Past Medical History:  Diagnosis Date  . Abnormal glucose 11/01/2013  . Arthritis   . BPH (benign prostatic hyperplasia)   . Chronic back pain   . Early satiety 11/18/2016  . Edema 03/28/2014  . Edema, pitting 03/28/2014  . Elevated WBC count 11/01/2013  . Esophageal candidiasis ([place]) 11/01/2013  . Headache   . History of pulmonary embolus (PE)   . Hyperglycemia, unspecified 11/01/2013  . Insomnia   . Loss of weight 11/18/2016  . Night sweat 08/19/2016  . Peripheral neuropathy   . Smoker 08/19/2016  . TB lung, latent 08/19/2016  . Weight gain 08/19/2016    Patient Active Problem List   Diagnosis Date Noted  . Cervicogenic headache (Primary Area of Pain) (Left) 11/10/2017  . Steroid-induced avascular necrosis of femoral head (Bilateral) 11/10/2017  . Post-traumatic osteoarthritis of multiple joints 11/07/2017  . BPH (benign prostatic hyperplasia) 11/07/2017  . Chronic lower extremity pain (Secondary Area of Pain) (Bilateral) (L>R) 10/06/2017  . Chronic low back pain [facility] Area of Pain) (Bilateral) (L>R) 10/06/2017  . Chronic hip pain (Fourth Area of Pain) (Bilateral) (L>R) 10/06/2017  .  Chronic sacroiliac joint pain (R) 10/06/2017  . Knee pain, chronic (Bilateral)(L>R) 10/06/2017  . Disorder of bone, unspecified 10/06/2017  . Other specified health status 10/06/2017  . Opioid withdrawal ([place]) 09/15/2017  . Radiculopathy 09/15/2017  . Cervical spinal stenosis 08/11/2017  . Lumbar radiculitis 08/11/2017  . Loss of weight 11/18/2016  . Early satiety 11/18/2016  . Nausea 09/04/2016  . TB lung, latent 08/04/202017  . Night sweats 08/04/202017  . Weight gain 08/04/202017  . Smoker 08/04/202017  . Neck pain, acute 08/04/2016  . Dental disease 05/18/2016  . Elevated sed rate 03/31/2016  . Arthralgia of multiple sites 03/31/2016  . Chronic fatigue 03/25/2016  . Contact with and exposure to tuberculosis 03/25/2016  . Numbness and tingling 03/05/2016  . Chronic painful diabetic neuropathy ([place]) 03/05/2016  . Osteoarthritis 01/22/2016  . Secondary adrenal insufficiency ([place]) 09/25/2015  . Left breast lump 04/15/2015  . Clinical depression 10/22/2014  . Exomphalos 08/02/2014  . Umbilical hernia without obstruction or gangrene 08/02/2014  . Arthropathy 07/26/2014  . Failed back syndrome of lumbar spine 04/12/2014  . Lumbar spondylosis 04/12/2014  . Chronic pain syndrome 04/12/2014  . Lumbosacral radiculopathy 04/12/2014  . Vascular disorder of lower extremity 04/09/2014  . History of anticoagulant therapy 04/09/2014  . Current tear knee, lateral meniscus 03/28/2014  . Articular cartilage disorder 03/28/2014  . Disorder of eye movements 11/01/2013  . Abnormal eye movements 11/01/2013  . Long term current use of opiate analgesic 09/25/2013  . Other long term (current)  drug therapy 09/25/2013  . Chronic use of opiate drugs therapeutic purposes 09/25/2013  . Back pain, thoracic 10/08/2012  . 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 Hypogonadism male 10/04/2012  . Back pain, chronic 09/29/2012  . Insomnia 09/29/2012  . 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 Cluster headache syndrome, not  intractable 07/17/2012  . Primary localized osteoarthrosis, lower leg 11/18/2011  . Cervical post-laminectomy syndrome 09/01/2011  . Narrowing of intervertebral disc space 11/28/2003  . Degeneration of intervertebral disc, site unspecified 11/28/2003    Past Surgical History:  Procedure Laterality Date  . BACK SURGERY    . BRAIN SURGERY     x5- spinal fluid leaks  . CERVICAL FUSION    . DG SHOULDER RIGHT COMPLETE     x6  . ESOPHAGOGASTRODUODENOSCOPY (EGD) WITH PROPOFOL N/A 06/15/2018   Procedure: ESOPHAGOGASTRODUODENOSCOPY (EGD) WITH PROPOFOL;  Surgeon: Toledo, Benay Pike, MD;  Location: [facility] ENDOSCOPY;  Service: Gastroenterology;  Laterality: N/A;  . HAND SURGERY Left   . HERNIA REPAIR    . KNEE ARTHROSCOPY Left    x3  . PROSTATE SURGERY    . UMBILICAL HERNIA REPAIR N/A 08/17/2017   Procedure: HERNIA REPAIR UMBILICAL ADULT;  Surgeon: Leonie Green, MD;  Location: [facility] ORS;  Service: General;  Laterality: N/A;  . WISDOM TOOTH EXTRACTION      Prior to Admission medications   Medication Sig Start Date End Date Taking? Authorizing Provider  B Complex Vitamins (VITAMIN-B COMPLEX) TABS Take by mouth.    [provider]  cephALEXin (KEFLEX) 500 MG capsule Take 1 capsule (500 mg total) by mouth 3 (three) times daily. 07/17/18   Kyle Luppino, Linden Dolin, PA-C  gabapentin (NEURONTIN) 400 MG capsule Take 400 mg by mouth 5 (five) times daily as needed (for pain.).     [provider]  Multiple Vitamin (MULTIVITAMIN WITH MINERALS) TABS tablet Take 1 tablet by mouth daily.    [provider]  naloxone [facility]) 0.4 MG/ML injection Inject 0.4 mg into the muscle every 2 (two) hours as needed (for opoid overuse.).  10/18/15   [provider]  ondansetron (ZOFRAN-ODT) 8 MG disintegrating tablet TAKE 1 TABLET (8 MG TOTAL) BY MOUTH EVERY 8 (EIGHT) HOURS AS NEEDED FOR NAUSEA FOR UP TO 10 DAYS. 07/31/17   [provider]  oxyCODONE (OXY IR/ROXICODONE) 5 MG immediate  release tablet TAKE 1 TABLET BY MOUTH EVERY 6 HOURS AS NEEDED FOR BREAKTHROUGH PAIN 09/09/17   [provider]  OxyCODONE HCl 30 MG TABA Take 30 mg by mouth 3 (three) times daily.    [provider]  QUEtiapine (SEROQUEL) 100 MG tablet TAKE 1 TABLET (100 MG TOTAL) BY MOUTH AT BEDTIME. Patient taking differently: Take 100 mg by mouth at bedtime.  10/29/16   Kathrine Haddock, NP  SUMAtriptan (IMITREX) 6 MG/0.5ML SOLN injection Inject 0.6 mg into the skin 2 (two) times daily as needed (*may repeat dose in 2 hrs--max 2/24hr*for migraine/cluster headaches.).  09/01/11   [provider]    Allergies Patient has no known allergies.  Family History  Problem Relation Age of Onset  . Cancer Mother        pancreatic and lung  . Cancer Father        leukemia  . Anxiety disorder Sister   . Anxiety disorder Sister   . Anxiety disorder Sister     Social History Social History   Tobacco Use  . Smoking status: Current Every Day Smoker    Packs/day: 0.50    Types: Cigarettes  .  Smokeless tobacco: Never Used  Substance Use Topics  . Alcohol use: No    Alcohol/week: 0.0 oz    Comment: three times a year  . Drug use: No    Review of Systems  Constitutional: No fever/chills Eyes: No visual changes. ENT: No sore throat. Respiratory: Denies cough Genitourinary: Negative for dysuria. Musculoskeletal: Negative for back pain.  Positive laceration to the left hand with decreased range of motion of the left index finger Skin: Negative for rash.    ____________________________________________   PHYSICAL EXAM:  VITAL SIGNS: ED Triage Vitals  Enc Vitals Group     BP 07/17/18 1341 (!) 171/93     Pulse Rate 07/17/18 1341 61     Resp 07/17/18 1341 17     Temp 07/17/18 1341 98.3 F (36.8 C)     Temp Source 07/17/18 1341 Oral     SpO2 07/17/18 1341 97 %     Weight 07/17/18 1342 175 lb (79.4 kg)     Height 07/17/18 1342 5\' 11"  (1.803 m)     Head Circumference --       Peak Flow --      Pain Score 07/17/18 1342 7     Pain Loc --      Pain Edu? --      Excl. in [place]? --     Constitutional: Alert and oriented. Well appearing and in no acute distress. Eyes: Conjunctivae are normal.  Head: Atraumatic. Nose: No congestion/rhinnorhea. Mouth/Throat: Mucous membranes are moist.   Neck:  supple no lymphadenopathy noted Cardiovascular: Normal rate, regular rhythm. Heart sounds are normal Respiratory: Normal respiratory effort.  No retractions, lungs c t a  GU: deferred Musculoskeletal: Decreased range of motion of the left index finger.  He is unable to extend the finger.  There is a 2 cm laceration on the dorsum of the left hand. Neurologic:  Normal speech and language.  Skin:  Skin is warm, dry.  Positive for 2 cm laceration to the left hand psychiatric: Mood and affect are normal. Speech and behavior are normal.  ____________________________________________   LABS (all labs ordered are listed, but only abnormal results are displayed)  Labs Reviewed - No data to display ____________________________________________   ____________________________________________  RADIOLOGY    ____________________________________________   PROCEDURES  Procedure(s) performed:   Marland KitchenMarland KitchenLaceration Repair Date/Time: 07/17/2018 6:16 PM Performed by: Versie Starks, PA-C Authorized by: Versie Starks, PA-C   Consent:    Consent obtained:  Verbal   Consent given by:  Patient   Risks discussed:  Infection, pain, poor wound healing, need for additional repair and tendon damage   Alternatives discussed:  No treatment Anesthesia (see MAR for exact dosages):    Anesthesia method:  Local infiltration   Local anesthetic:  Lidocaine 1% w/o epi Laceration details:    Location:  Hand   Length (cm):  2   Depth (mm):  3 Repair type:    Repair type:  Intermediate Pre-procedure details:    Preparation:  Patient was prepped and draped in usual sterile fashion Exploration:      Hemostasis achieved with:  Direct pressure   Wound extent: tendon damage     Tendon damage location:  Upper extremity   Upper extremity tendon damage location:  Finger extensor   Tendon damage extent:  Complete transection   Tendon repair plan:  Refer for evaluation   Contaminated: yes   Treatment:    Area cleansed with:  Betadine and saline   Amount of  cleaning:  Extensive   Irrigation solution:  Sterile saline   Irrigation method:  Pressure wash, syringe and tap   Visualized foreign bodies/material removed: yes   Skin repair:    Repair method:  Sutures   Suture size:  5-0   Suture material:  Nylon   Suture technique:  Simple interrupted   Number of sutures:  3 Approximation:    Approximation:  Close Post-procedure details:    Dressing:  Antibiotic ointment, non-adherent dressing and splint for protection   Patient tolerance of procedure:  Tolerated well, no immediate complications      ____________________________________________   INITIAL IMPRESSION / ASSESSMENT AND PLAN / ED COURSE  Pertinent labs & imaging results that were available during my care of the patient were reviewed by me and considered in my medical decision making (see chart for details).   Patient is a 62 year old male presents emergency department complaining of a laceration to the top of the left hand.  He states he was using a grinder at home to work on his car.  He thinks he cut a tendon as he cannot extend his finger as normal.  On physical exam the patient appears well.  There is a 2 cm laceration to the dorsum of the left hand.  The hands are very dirty from the grinding of metal.  There is some of this same material noted in the wound.  The patient does have decreased range of motion of the left index finger with extension.  He is neurovascularly intact.  The area was cleaned extensively and 3 sutures were applied.  Part of the tendon was located but the other half was not.  Page Dr. Sabra Heck and  discussed case with him.  He said for the patient follow-up with Dr. Jackqulyn Livings this week.  He is to call and make an appointment on Monday.  The patient was placed in a volar OCL to prevent further damage to the tendons.  He was given a prescription for Keflex due to the wound being so dirty.  He is to call and make an appointment with [facility].  He states he understands will comply with our instructions.  He was discharged in stable condition     As part of my medical decision making, I reviewed the following data within the [place] notes reviewed and incorporated, Old chart reviewed, Notes from prior ED visits and [place] Controlled Substance Database  ____________________________________________   FINAL CLINICAL IMPRESSION(S) / ED DIAGNOSES  Final diagnoses:  Laceration of left hand without foreign body, initial encounter      NEW MEDICATIONS STARTED DURING THIS VISIT:  Discharge Medication List as of 07/17/2018  3:02 PM    START taking these medications   Details  cephALEXin (KEFLEX) 500 MG capsule Take 1 capsule (500 mg total) by mouth 3 (three) times daily., Starting Sun 07/17/2018, Print         Note:  This document was prepared using Dragon voice recognition software and may include unintentional dictation errors.    Versie Starks, PA-C 07/17/18 1820    Schuyler Amor, MD 07/19/18 2250

## 2018-07-17 NOTE — ED Notes (Signed)
Pt asking to go to his car so he can take something for pain. Informed him he was not allowed to leave and would need to wait until seen by the provider.

## 2018-07-18 DIAGNOSIS — S61412A Laceration without foreign body of left hand, initial encounter: Secondary | ICD-10-CM | POA: Insufficient documentation

## 2018-07-22 DIAGNOSIS — R29898 Other symptoms and signs involving the musculoskeletal system: Secondary | ICD-10-CM | POA: Diagnosis not present

## 2018-07-22 DIAGNOSIS — S61412S Laceration without foreign body of left hand, sequela: Secondary | ICD-10-CM | POA: Diagnosis not present

## 2018-07-26 DIAGNOSIS — S61012A Laceration without foreign body of left thumb without damage to nail, initial encounter: Secondary | ICD-10-CM | POA: Diagnosis not present

## 2018-07-27 DIAGNOSIS — R29818 Other symptoms and signs involving the nervous system: Secondary | ICD-10-CM | POA: Diagnosis not present

## 2018-07-29 DIAGNOSIS — S66222A Laceration of extensor muscle, fascia and tendon of left thumb at wrist and hand level, initial encounter: Secondary | ICD-10-CM | POA: Diagnosis not present

## 2018-07-29 DIAGNOSIS — F17210 Nicotine dependence, cigarettes, uncomplicated: Secondary | ICD-10-CM | POA: Diagnosis not present

## 2018-07-29 DIAGNOSIS — Z96659 Presence of unspecified artificial knee joint: Secondary | ICD-10-CM | POA: Diagnosis not present

## 2018-07-29 DIAGNOSIS — R51 Headache: Secondary | ICD-10-CM | POA: Diagnosis not present

## 2018-07-29 DIAGNOSIS — S61012A Laceration without foreign body of left thumb without damage to nail, initial encounter: Secondary | ICD-10-CM | POA: Diagnosis not present

## 2018-08-02 DIAGNOSIS — S66012D Strain of long flexor muscle, fascia and tendon of left thumb at wrist and hand level, subsequent encounter: Secondary | ICD-10-CM | POA: Diagnosis not present

## 2018-08-05 ENCOUNTER — Emergency Department: Payer: PPO

## 2018-08-05 ENCOUNTER — Other Ambulatory Visit: Payer: Self-pay

## 2018-08-05 ENCOUNTER — Encounter: Payer: Self-pay | Admitting: Emergency Medicine

## 2018-08-05 ENCOUNTER — Emergency Department
Admission: EM | Admit: 2018-08-05 | Discharge: 2018-08-05 | Disposition: A | Discharge status: Home / Self Care | Payer: PPO | Attending: Emergency Medicine | Admitting: Emergency Medicine

## 2018-08-05 DIAGNOSIS — R0602 Shortness of breath: Secondary | ICD-10-CM | POA: Insufficient documentation

## 2018-08-05 DIAGNOSIS — R002 Palpitations: Secondary | ICD-10-CM | POA: Diagnosis not present

## 2018-08-05 DIAGNOSIS — R0600 Dyspnea, unspecified: Secondary | ICD-10-CM | POA: Diagnosis not present

## 2018-08-05 DIAGNOSIS — F17210 Nicotine dependence, cigarettes, uncomplicated: Secondary | ICD-10-CM | POA: Diagnosis not present

## 2018-08-05 DIAGNOSIS — Z79899 Other long term (current) drug therapy: Secondary | ICD-10-CM | POA: Insufficient documentation

## 2018-08-05 LAB — CBC
HEMATOCRIT: 43.8 % (ref 40.0–52.0)
HEMOGLOBIN: 15.3 g/dL (ref 13.0–18.0)
MCH: 32.6 pg (ref 26.0–34.0)
MCHC: 35 g/dL (ref 32.0–36.0)
MCV: 93 fL (ref 80.0–100.0)
Platelets: 306 10*3/uL (ref 150–440)
RBC: 4.71 MIL/uL (ref 4.40–5.90)
RDW: 14.5 % (ref 11.5–14.5)
WBC: 8.2 10*3/uL (ref 3.8–10.6)

## 2018-08-05 LAB — BASIC METABOLIC PANEL
Anion gap: 6 (ref 5–15)
BUN: 20 mg/dL (ref 6–20)
CO2: 27 mmol/L (ref 22–32)
CREATININE: 0.79 mg/dL (ref 0.61–1.24)
Calcium: 9.4 mg/dL (ref 8.9–10.3)
Chloride: 108 mmol/L (ref 98–111)
GFR calc Af Amer: >60 mL/min (ref >60)
GFR calc non Af Amer: >60 mL/min (ref >60)
GLUCOSE: 92 mg/dL (ref 70–99)
POTASSIUM: 4.1 mmol/L (ref 3.5–5.1)
Sodium: 141 mmol/L (ref 135–145)

## 2018-08-05 LAB — TROPONIN I: Troponin I: <0.03 ng/mL (ref <0.03)

## 2018-08-05 MED ORDER — IOPAMIDOL (ISOVUE-370) INJECTION 76%
75.0000 mL | Freq: Once | INTRAVENOUS | Status: AC | PRN
  Administered 2018-08-05: 75 mL via INTRAVENOUS

## 2018-08-05 NOTE — ED Triage Notes (Signed)
C/O intermittent episodes of palpitations x 1 week.  Patient states he has been feeling this way since his tendon repair last week.

## 2018-08-05 NOTE — ED Notes (Signed)
Discharge instructions reviewed with patient. Questions fielded by this RN. Patient verbalizes understanding of instructions. Patient discharged home in stable condition per rifenbark. No acute distress noted at time of discharge.   Peripheral IV discontinued. Catheter intact. No signs of infiltration or redness. Gauze applied to IV site.

## 2018-08-05 NOTE — ED Notes (Signed)
Pt reports palpitations today since the last week and occurrence infrequent since surgery on left wrist after accident with power tool  Pt denies associated s/sx, or cardiac hx

## 2018-08-05 NOTE — Discharge Instructions (Signed)
Fortunately today your blood work and your CT scan were reassuring.  Please follow-up with cardiology within the next week or so for reevaluation and return to the emergency department for any issues whatsoever.  It was a pleasure to take care of you today, and thank you for coming to our emergency department.  If you have any questions or concerns before leaving please ask the nurse to grab me and I'm more than happy to go through your aftercare instructions again.  If you were prescribed any opioid pain medication today such as Norco, Vicodin, Percocet, morphine, hydrocodone, or oxycodone please make sure you do not drive when you are taking this medication as it can alter your ability to drive safely.  If you have any concerns once you are home that you are not improving or are in fact getting worse before you can make it to your follow-up appointment, please do not hesitate to call 911 and come back for further evaluation.  Darel Hong, MD  Results for orders placed or performed during the hospital encounter of 47/42/59  Basic metabolic panel  Result Value Ref Range   Sodium 141 135 - 145 mmol/L   Potassium 4.1 3.5 - 5.1 mmol/L   Chloride 108 98 - 111 mmol/L   CO2 27 22 - 32 mmol/L   Glucose, Bld 92 70 - 99 mg/dL   BUN 20 6 - 20 mg/dL   Creatinine, Ser 0.79 0.61 - 1.24 mg/dL   Calcium 9.4 8.9 - 10.3 mg/dL   GFR calc non Af Amer >60 >60 mL/min   GFR calc Af Amer >60 >60 mL/min   Anion gap 6 5 - 15  CBC  Result Value Ref Range   WBC 8.2 3.8 - 10.6 K/uL   RBC 4.71 4.40 - 5.90 MIL/uL   Hemoglobin 15.3 13.0 - 18.0 g/dL   HCT 43.8 40.0 - 52.0 %   MCV 93.0 80.0 - 100.0 fL   MCH 32.6 26.0 - 34.0 pg   MCHC 35.0 32.0 - 36.0 g/dL   RDW 14.5 11.5 - 14.5 %   Platelets 306 150 - 440 K/uL  Troponin I  Result Value Ref Range   Troponin I <0.03 <0.03 ng/mL   Dg Chest 2 View  Result Date: 08/05/2018 CLINICAL DATA:  Palpitations.  Shortness of breath. EXAM: CHEST - 2 VIEW COMPARISON:  CT  09/21/2017.  Chest x-ray 06-29-202017. FINDINGS: Mediastinum and hilar structures are normal. Heart size stable. Mild increase interstitial prominence noted bilaterally. A component of these changes may be chronic, however an active interstitial process including pneumonitis cannot be excluded. Questionable density noted over the lateral aspect of the right mid lung. This may be related interstitial disease. A follow-up PA and lateral chest x-ray suggested. These findings do not clear chest CT may be needed for further evaluation. No pleural effusion or pneumothorax. Pectus deformity noted. Degenerative change thoracic spine. IMPRESSION: 1. Mild increased interstitial prominence noted bilaterally. A component of these changes may be chronic, however an active interstitial process including pneumonitis cannot be excluded. 2. Questionable density noted over the lateral aspect of the right mid lung. This may be related interstitial disease. Follow-up PA lateral chest x-ray suggested. If these findings do not clear chest CT may be needed for further evaluation. Electronically Signed   By: Marcello Moores  Register   On: 08/05/2018 15:33   Ct Angio Chest Pe W/cm &/or Wo Cm  Result Date: 08/05/2018 CLINICAL DATA:  Intermittent palpitations and dyspnea x1 week. EXAM: CT  ANGIOGRAPHY CHEST WITH CONTRAST TECHNIQUE: Multidetector CT imaging of the chest was performed using the standard protocol during bolus administration of intravenous contrast. Multiplanar CT image reconstructions and MIPs were obtained to evaluate the vascular anatomy. CONTRAST:  88mL ISOVUE-370 IOPAMIDOL (ISOVUE-370) INJECTION 76% COMPARISON:  Same day chest radiographs and chest CT from 09/21/2017 FINDINGS: Cardiovascular: Preferential enhancement of the pulmonary arteries to the segmental level without acute pulmonary embolus. Aortic atherosclerosis without aneurysm or dissection. Heart size is normal. No pericardial effusion. Mediastinum/Nodes: Scattered small  mediastinal and bilateral hilar lymph nodes are noted as before with smaller index nodule in the precarinal portion of the mediastinum now approximately 10 mm versus 14 mm short axis previously, series 4/45. Smaller right hilar lymph node now 12 mm versus 14 mm previously, series 4/52. Smaller left infrahilar lymph node measuring 9 mm versus 12 mm previously, series 4/55. Patent trachea and mainstem bronchi. Esophagus is unremarkable. Lungs/Pleura: Prominent centrilobular emphysema without pulmonary consolidation, dominant mass, effusion or pneumothorax. Bibasilar dependent atelectasis. Upper Abdomen: Simple cyst in the upper pole the left kidney measuring 3.5 cm in diameter. The included liver, gallbladder, right kidney and spleen are normal. Atrophic appearance of the pancreas without inflammation or mass. Normal bilateral adrenal glands. The included stomach and intestine demonstrating increased fecal retention within the colon. No inflammation or obstruction. Musculoskeletal: T1-T2 degenerative disc disease. No acute nor suspicious osseous abnormalities. Review of the MIP images confirms the above findings. IMPRESSION: 1. No acute pulmonary embolus. 2. Minimal aortic atherosclerosis without aneurysm. 3. Centrilobular emphysema. 4. Smaller mediastinal and bilateral hilar lymph nodes since prior. These may be reactive in etiology. The largest index nodule measuring 12 mm short axis is seen in the right hilum. Aortic Atherosclerosis (ICD10-I70.0) and Emphysema (ICD10-J43.9). Electronically Signed   By: Ashley Royalty M.D.   On: 08/05/2018 18:53

## 2018-08-05 NOTE — ED Provider Notes (Signed)
[facility] Emergency Department Provider Note  ____________________________________________   First MD Initiated Contact with Patient 08/05/18 1727     (approximate)  I have reviewed the triage vital signs and the nursing notes.   HISTORY  Chief Complaint Palpitations   HPI Gregory Cervantes is a 62 y.o. male who presents to the emergency department with intermittent palpitations for the past week or so that began after having surgery to his left wrist.  He reports intermittent mild shortness of breath.  He is concerned because he has a remote history of a pulmonary embolism after a previous surgery and is worried that he could have another blood clot.  He denies leg swelling.  He denies chest pain.  His palpitations are short-lived lasting seconds at a time.  Nothing seems to make them better or worse.  He has not passed out.   He takes no blood thinning medications.  His symptoms are short-lived and moderate in severity.   Past Medical History:  Diagnosis Date  . Abnormal glucose 11/01/2013  . Arthritis   . BPH (benign prostatic hyperplasia)   . Chronic back pain   . Early satiety 11/18/2016  . Edema 03/28/2014  . Edema, pitting 03/28/2014  . Elevated WBC count 11/01/2013  . Esophageal candidiasis ([place]) 11/01/2013  . Headache   . History of pulmonary embolus (PE)   . Hyperglycemia, unspecified 11/01/2013  . Insomnia   . Loss of weight 11/18/2016  . Night sweat 08/19/2016  . Peripheral neuropathy   . Smoker 08/19/2016  . TB lung, latent 08/19/2016  . Weight gain 08/19/2016    Patient Active Problem List   Diagnosis Date Noted  . Cervicogenic headache (Primary Area of Pain) (Left) 11/10/2017  . Steroid-induced avascular necrosis of femoral head (Bilateral) 11/10/2017  . Post-traumatic osteoarthritis of multiple joints 11/07/2017  . BPH (benign prostatic hyperplasia) 11/07/2017  . Chronic lower extremity pain (Secondary Area of Pain) (Bilateral) (L>R)  10/06/2017  . Chronic low back pain [facility] Area of Pain) (Bilateral) (L>R) 10/06/2017  . Chronic hip pain (Fourth Area of Pain) (Bilateral) (L>R) 10/06/2017  . Chronic sacroiliac joint pain (R) 10/06/2017  . Knee pain, chronic (Bilateral)(L>R) 10/06/2017  . Disorder of bone, unspecified 10/06/2017  . Other specified health status 10/06/2017  . Opioid withdrawal ([place]) 09/15/2017  . Radiculopathy 09/15/2017  . Cervical spinal stenosis 08/11/2017  . Lumbar radiculitis 08/11/2017  . Loss of weight 11/18/2016  . Early satiety 11/18/2016  . Nausea 09/04/2016  . TB lung, latent 11-13-2016  . Night sweats 11-13-2016  . Weight gain 11-13-2016  . Smoker 11-13-2016  . Neck pain, acute 08/04/2016  . Dental disease 05/18/2016  . Elevated sed rate 03/31/2016  . Arthralgia of multiple sites 03/31/2016  . Chronic fatigue 03/25/2016  . Contact with and exposure to tuberculosis 03/25/2016  . Numbness and tingling 03/05/2016  . Chronic painful diabetic neuropathy ([place]) 03/05/2016  . Osteoarthritis 01/22/2016  . Secondary adrenal insufficiency ([place]) 09/25/2015  . Left breast lump 04/15/2015  . Clinical depression 10/22/2014  . Exomphalos 08/02/2014  . Umbilical hernia without obstruction or gangrene 08/02/2014  . Arthropathy 07/26/2014  . Failed back syndrome of lumbar spine 04/12/2014  . Lumbar spondylosis 04/12/2014  . Chronic pain syndrome 04/12/2014  . Lumbosacral radiculopathy 04/12/2014  . Vascular disorder of lower extremity 04/09/2014  . History of anticoagulant therapy 04/09/2014  . Current tear knee, lateral meniscus 03/28/2014  . Articular cartilage disorder 03/28/2014  . Disorder of eye movements 11/01/2013  .  Abnormal eye movements 11/01/2013  . Long term current use of opiate analgesic 09/25/2013  . Other long term (current) drug therapy 09/25/2013  . Chronic use of opiate drugs therapeutic purposes 09/25/2013  . Back pain, thoracic 10/08/2012  . Eunuchoidism 10/04/2012    . Hypogonadism male 10/04/2012  . Back pain, chronic 09/29/2012  . Insomnia 09/29/2012  . 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 Cluster headache syndrome, not intractable 07/17/2012  . Primary localized osteoarthrosis, lower leg 11/18/2011  . Cervical post-laminectomy syndrome 09/01/2011  . Narrowing of intervertebral disc space 11/28/2003  . Degeneration of intervertebral disc, site unspecified 11/28/2003    Past Surgical History:  Procedure Laterality Date  . BACK SURGERY    . BRAIN SURGERY     x5- spinal fluid leaks  . CERVICAL FUSION    . DG SHOULDER RIGHT COMPLETE     x6  . ESOPHAGOGASTRODUODENOSCOPY (EGD) WITH PROPOFOL N/A 06/15/2018   Procedure: ESOPHAGOGASTRODUODENOSCOPY (EGD) WITH PROPOFOL;  Surgeon: Toledo, Benay Pike, MD;  Location: [facility] ENDOSCOPY;  Service: Gastroenterology;  Laterality: N/A;  . HAND SURGERY Left   . HERNIA REPAIR    . KNEE ARTHROSCOPY Left    x3  . PROSTATE SURGERY    . UMBILICAL HERNIA REPAIR N/A 08/17/2017   Procedure: HERNIA REPAIR UMBILICAL ADULT;  Surgeon: Leonie Green, MD;  Location: [facility] ORS;  Service: General;  Laterality: N/A;  . WISDOM TOOTH EXTRACTION      Prior to Admission medications   Medication Sig Start Date End Date Taking? Authorizing Provider  B Complex Vitamins (VITAMIN-B COMPLEX) TABS Take by mouth.    [provider]  cephALEXin (KEFLEX) 500 MG capsule Take 1 capsule (500 mg total) by mouth 3 (three) times daily. 07/17/18   Fisher, Linden Dolin, PA-C  gabapentin (NEURONTIN) 400 MG capsule Take 400 mg by mouth 5 (five) times daily as needed (for pain.).     [provider]  Multiple Vitamin (MULTIVITAMIN WITH MINERALS) TABS tablet Take 1 tablet by mouth daily.    [provider]  naloxone [facility]) 0.4 MG/ML injection Inject 0.4 mg into the muscle every 2 (two) hours as needed (for opoid overuse.).  10/18/15   [provider]  ondansetron (ZOFRAN-ODT) 8 MG disintegrating tablet TAKE 1 TABLET (8 MG  TOTAL) BY MOUTH EVERY 8 (EIGHT) HOURS AS NEEDED FOR NAUSEA FOR UP TO 10 DAYS. 07/31/17   [provider]  oxyCODONE (OXY IR/ROXICODONE) 5 MG immediate release tablet TAKE 1 TABLET BY MOUTH EVERY 6 HOURS AS NEEDED FOR BREAKTHROUGH PAIN 09/09/17   [provider]  OxyCODONE HCl 30 MG TABA Take 30 mg by mouth 3 (three) times daily.    [provider]  QUEtiapine (SEROQUEL) 100 MG tablet TAKE 1 TABLET (100 MG TOTAL) BY MOUTH AT BEDTIME. Patient taking differently: Take 100 mg by mouth at bedtime.  10/29/16   Kathrine Haddock, NP  SUMAtriptan (IMITREX) 6 MG/0.5ML SOLN injection Inject 0.6 mg into the skin 2 (two) times daily as needed (*may repeat dose in 2 hrs--max 2/24hr*for migraine/cluster headaches.).  09/01/11   [provider]    Allergies Patient has no known allergies.  Family History  Problem Relation Age of Onset  . Cancer Mother        pancreatic and lung  . Cancer Father        leukemia  . Anxiety disorder Sister   . Anxiety disorder Sister   . Anxiety disorder Sister     Social History Social History  Tobacco Use  . Smoking status: Current Every Day Smoker    Packs/day: 0.50    Types: Cigarettes  . Smokeless tobacco: Never Used  Substance Use Topics  . Alcohol use: No    Alcohol/week: 0.0 standard drinks    Comment: three times a year  . Drug use: No    Review of Systems Constitutional: No fever/chills Eyes: No visual changes. ENT: No sore throat. Cardiovascular: Denies chest pain. Respiratory: Positive for shortness of breath. Gastrointestinal: No abdominal pain.  No nausea, no vomiting.  No diarrhea.  No constipation. Genitourinary: Negative for dysuria. Musculoskeletal: Negative for back pain. Skin: Negative for rash. Neurological: Negative for headaches, focal weakness or numbness.   ____________________________________________   PHYSICAL EXAM:  VITAL SIGNS: ED Triage Vitals  Enc Vitals Group     BP 08/05/18 1507  120/79     Pulse Rate 08/05/18 1507 68     Resp 08/05/18 1507 16     Temp 08/05/18 1507 98.2 F (36.8 C)     Temp Source 08/05/18 1507 Oral     SpO2 08/05/18 1507 99 %     Weight 08/05/18 1505 175 lb (79.4 kg)     Height 08/05/18 1505 5\' 11"  (1.803 m)     Head Circumference --      Peak Flow --      Pain Score 08/05/18 1505 0     Pain Loc --      Pain Edu? --      Excl. in [place]? --     Constitutional: Alert and oriented x4 somewhat anxious appearing nontoxic no diaphoresis speaks focally sentences Eyes: PERRL EOMI. Head: Atraumatic. Nose: No congestion/rhinnorhea. Mouth/Throat: No trismus Neck: No stridor.   Cardiovascular: Normal rate, regular rhythm. Grossly normal heart sounds.  Good peripheral circulation. Respiratory: Normal respiratory effort.  No retractions. Lungs CTAB and moving good air Gastrointestinal: Soft nontender Musculoskeletal: Left wrist in splint neurovascularly intact Neurologic:  Normal speech and language. No gross focal neurologic deficits are appreciated. Skin:  Skin is warm, dry and intact. No rash noted. Psychiatric: Mood and affect are normal. Speech and behavior are normal.    ____________________________________________   DIFFERENTIAL includes but not limited to  SVT, atrial fibrillation, ventricular tachycardia, acute coronary syndrome, dehydration, pulmonary embolism ____________________________________________   LABS (all labs ordered are listed, but only abnormal results are displayed)  Labs Reviewed  BASIC METABOLIC PANEL  CBC  TROPONIN I    Lab work reviewed by me with no acute disease noted __________________________________________  EKG  ED ECG REPORT I, Darel Hong, the attending physician, personally viewed and interpreted this ECG.  Date: 08/07/2018 EKG Time:  Rate: 79 Rhythm: normal sinus rhythm QRS Axis: normal Intervals: normal ST/T Wave abnormalities: normal Narrative Interpretation: no evidence of acute  ischemia  ____________________________________________  RADIOLOGY  Chest x-ray reviewed by me with no acute disease CT angiogram reviewed by me with no blood clot ____________________________________________   PROCEDURES  Procedure(s) performed: no  Procedures  Critical Care performed: no  ____________________________________________   INITIAL IMPRESSION / ASSESSMENT AND PLAN / ED COURSE  Pertinent labs & imaging results that were available during my care of the patient were reviewed by me and considered in my medical decision making (see chart for details).   As part of my medical decision making, I reviewed the following data within the [place] History obtained from family if available, nursing notes, old chart and ekg, as well as notes from prior ED visits.  Patient arrives with atypical palpitations and intermittent shortness of breath.  He says this feels similar to when he had a previous pulmonary embolism.  EKG with no rightward axis, however given his recent surgery and that this feels similar to previous PE a CT angiogram is pending.  Fortunately the patient's CT scan is reassuring.  I had a lengthy discussion with the patient regarding the diagnostic uncertainty.  He was kept on monitor multiple hours with no ectopy.  I will provide him with an ambulatory referral to cardiology to consider possible Holter monitor etc.  Strict return precautions have been given.      ____________________________________________   FINAL CLINICAL IMPRESSION(S) / ED DIAGNOSES  Final diagnoses:  Heart palpitations      NEW MEDICATIONS STARTED DURING THIS VISIT:  Discharge Medication List as of 08/05/2018  7:05 PM       Note:  This document was prepared using Dragon voice recognition software and may include unintentional dictation errors.     Darel Hong, MD 08/07/18 [phone]

## 2018-08-08 ENCOUNTER — Telehealth: Payer: Self-pay

## 2018-08-08 NOTE — Telephone Encounter (Signed)
Patient scheduled for Oct 20

## 2018-08-08 NOTE — Telephone Encounter (Signed)
Lmov for patient to call and schedule appointment Seen in ED on 08/05/18 for Palpatations

## 2018-08-17 DIAGNOSIS — R002 Palpitations: Secondary | ICD-10-CM | POA: Diagnosis not present

## 2018-08-17 DIAGNOSIS — R9389 Abnormal findings on diagnostic imaging of other specified body structures: Secondary | ICD-10-CM | POA: Diagnosis not present

## 2018-08-17 DIAGNOSIS — Z79899 Other long term (current) drug therapy: Secondary | ICD-10-CM | POA: Diagnosis not present

## 2018-08-17 DIAGNOSIS — M5412 Radiculopathy, cervical region: Secondary | ICD-10-CM | POA: Diagnosis not present

## 2018-08-24 DIAGNOSIS — G8929 Other chronic pain: Secondary | ICD-10-CM | POA: Diagnosis not present

## 2018-08-24 DIAGNOSIS — M549 Dorsalgia, unspecified: Secondary | ICD-10-CM | POA: Diagnosis not present

## 2018-08-24 DIAGNOSIS — M7710 Lateral epicondylitis, unspecified elbow: Secondary | ICD-10-CM | POA: Insufficient documentation

## 2018-08-24 DIAGNOSIS — M5412 Radiculopathy, cervical region: Secondary | ICD-10-CM | POA: Diagnosis not present

## 2018-08-24 DIAGNOSIS — R52 Pain, unspecified: Secondary | ICD-10-CM | POA: Diagnosis not present

## 2018-08-26 ENCOUNTER — Telehealth: Payer: Self-pay | Admitting: Cardiovascular Disease

## 2018-08-26 NOTE — Telephone Encounter (Signed)
lmov to schedule appt  °

## 2018-08-30 NOTE — Telephone Encounter (Signed)
Patient has been rescheduled  °Nothing else needed. ° °

## 2018-09-01 DIAGNOSIS — S66012D Strain of long flexor muscle, fascia and tendon of left thumb at wrist and hand level, subsequent encounter: Secondary | ICD-10-CM | POA: Diagnosis not present

## 2018-09-07 DIAGNOSIS — Z79899 Other long term (current) drug therapy: Secondary | ICD-10-CM | POA: Diagnosis not present

## 2018-09-07 DIAGNOSIS — M129 Arthropathy, unspecified: Secondary | ICD-10-CM | POA: Diagnosis not present

## 2018-09-07 DIAGNOSIS — M4802 Spinal stenosis, cervical region: Secondary | ICD-10-CM | POA: Diagnosis not present

## 2018-09-07 DIAGNOSIS — M47816 Spondylosis without myelopathy or radiculopathy, lumbar region: Secondary | ICD-10-CM | POA: Diagnosis not present

## 2018-09-09 IMAGING — CT CT CERVICAL SPINE W/ CM
3 of 4 series · 13 of 33 positions shown, 16 images · IV contrast (isovue)
Comparison: MRI of the cervical spine 09/05/2016.

CLINICAL DATA: Cervical disc disease with myelopathy.

EXAM:
CT MYELOGRAPHY CERVICAL SPINE
TECHNIQUE: CT imaging of the cervical spine was performed after Isovue 300 M
contrast administration. Multiplanar CT image reconstructions were
also generated.

[Series 4: sagittal bone · sagittal · 0.43mm/px · 5 of 88 slices shown, 6 images]
[im 30/88  bone]
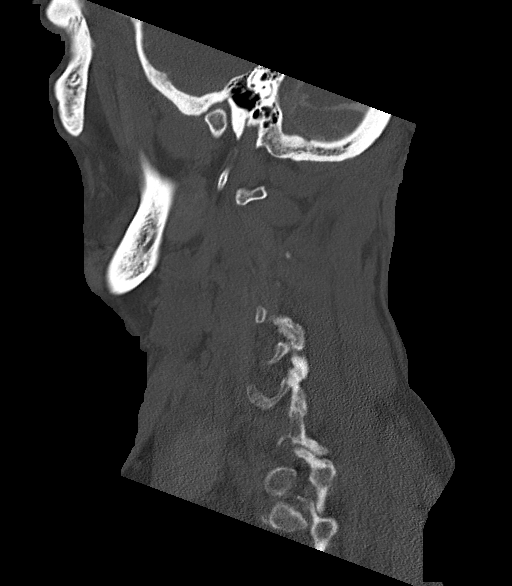
[im 37/88  bone]
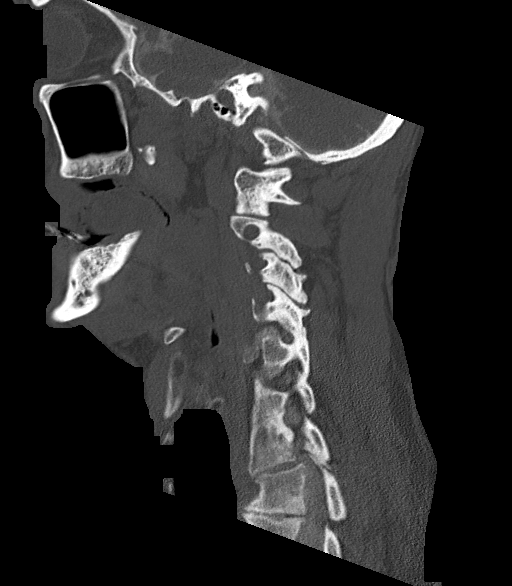
[im 44/88  soft-tissue]
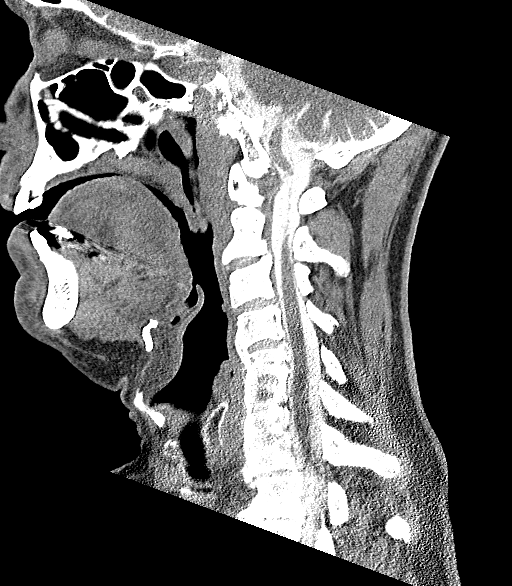
[im 44/88  bone]
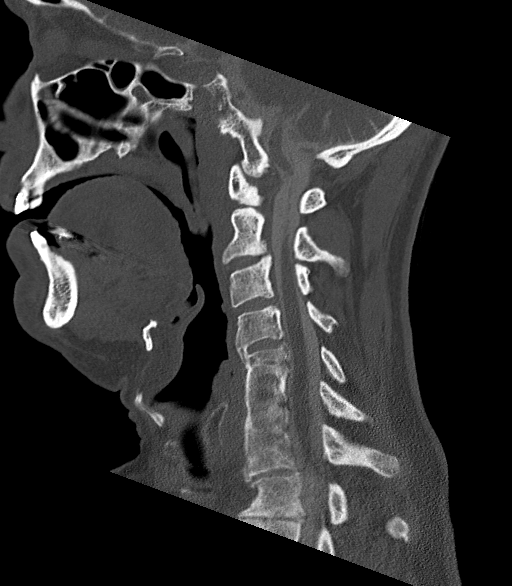
[im 51/88  bone]
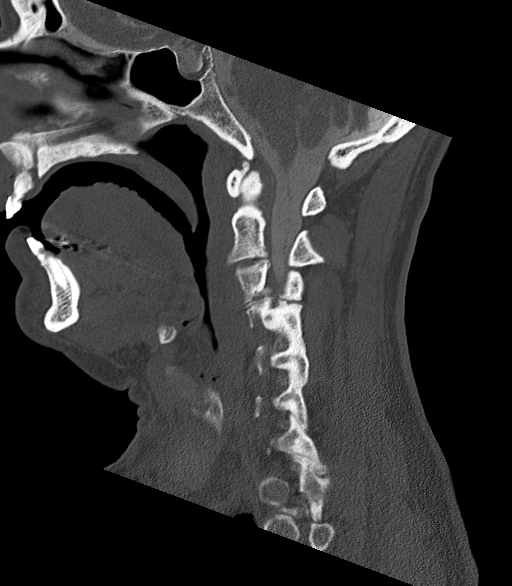
[im 59/88  bone]
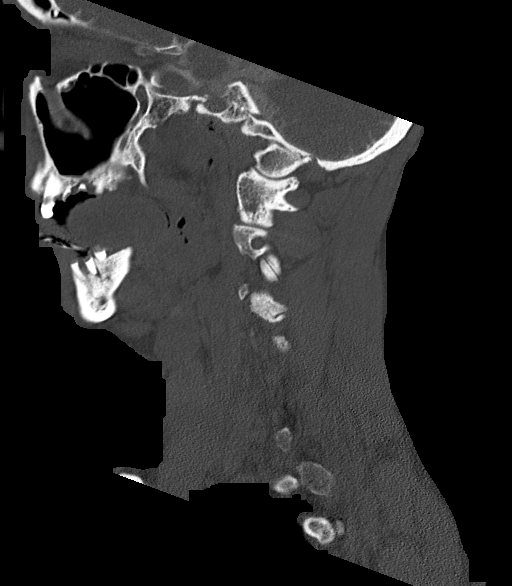

[Series 5: coronal bone · coronal · 0.28mm/px · 3 of 75 slices shown]
[im 18/75  bone]
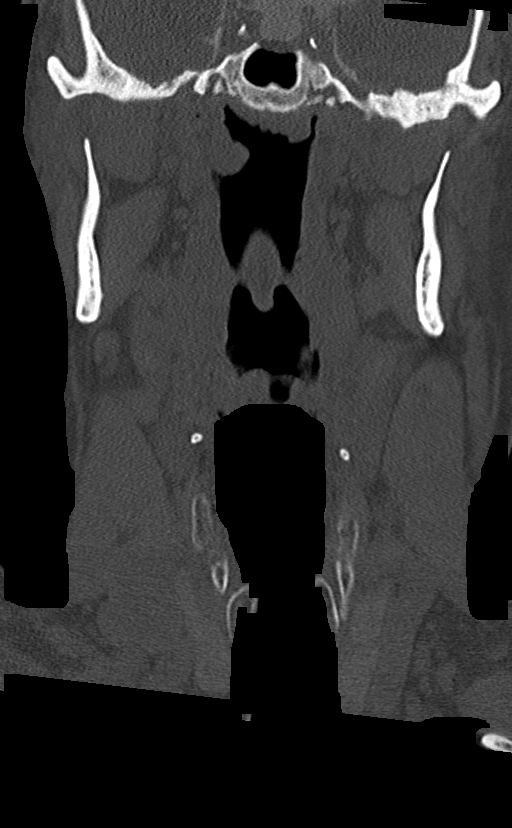
[im 31/75  bone]
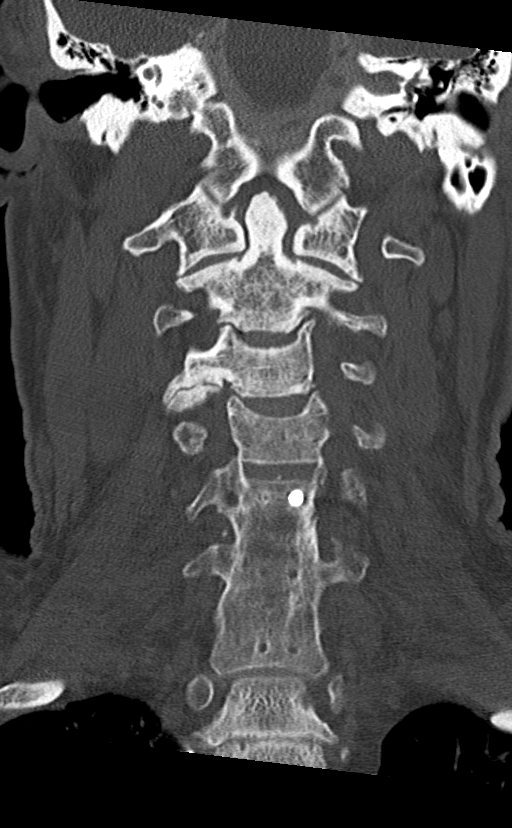
[im 44/75  bone]
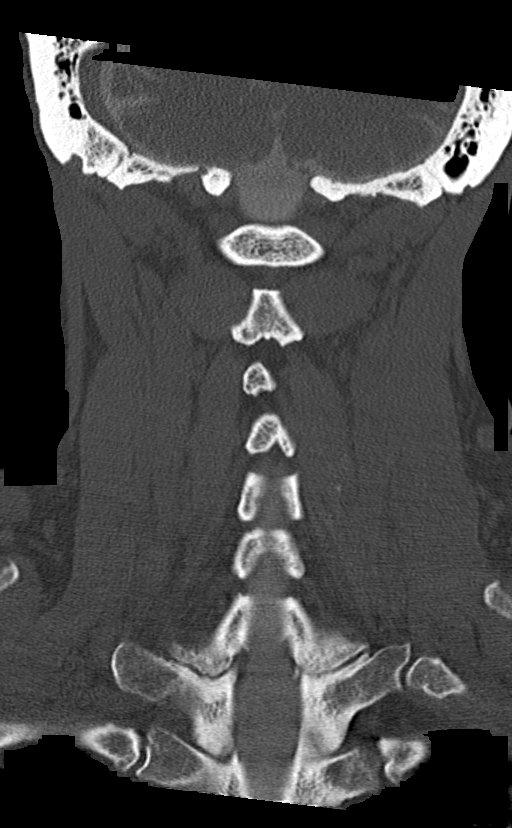

[Series 6: orthogonal bone · axial · 0.26mm/px · z∈[-240,-90]mm · 5 of 119 slices shown, 7 images]
[im 17/119  soft-tissue]
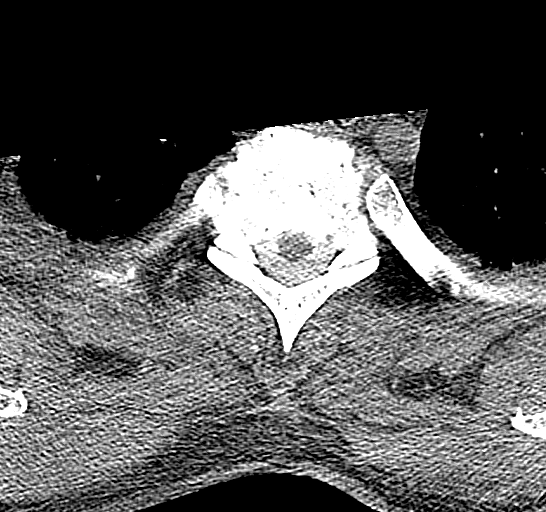
[im 17/119  bone]
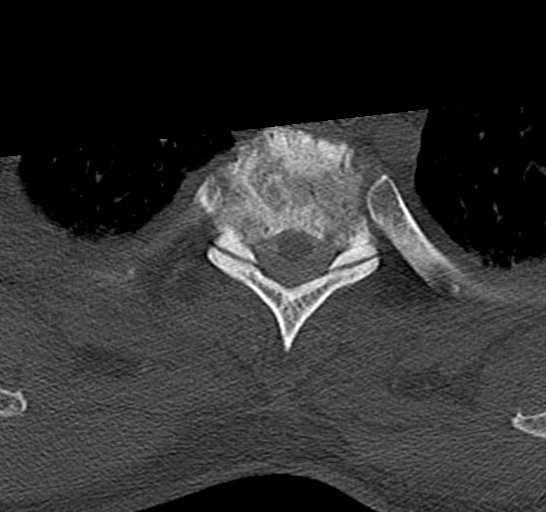
[im 34/119  bone]
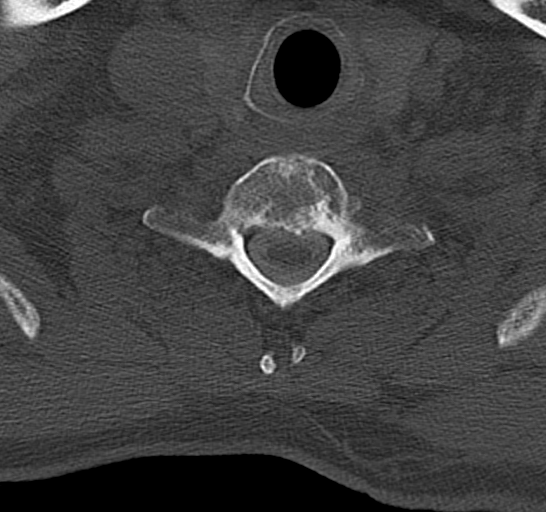
[im 68/119  bone]
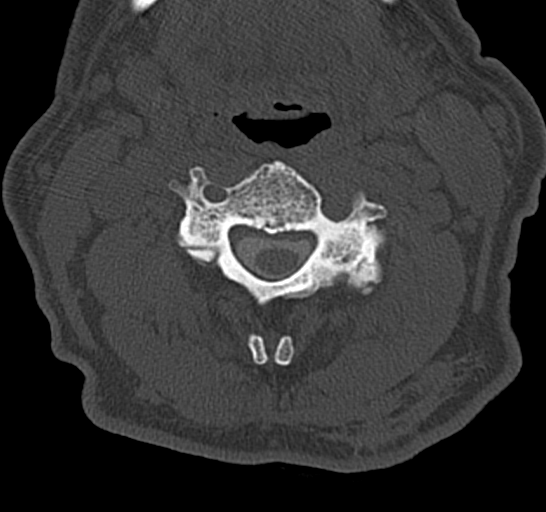
[im 85/119  bone]
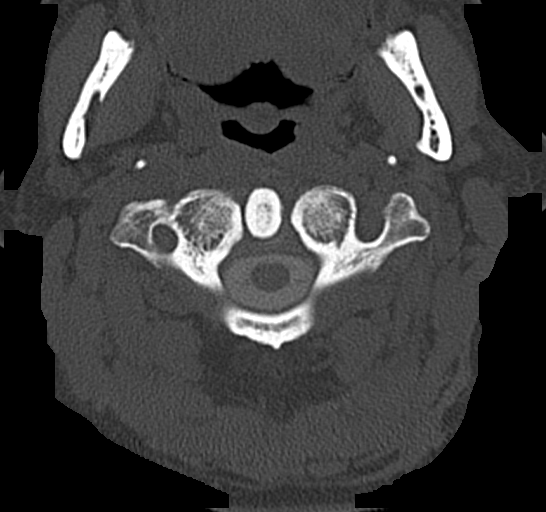
[im 102/119  soft-tissue]
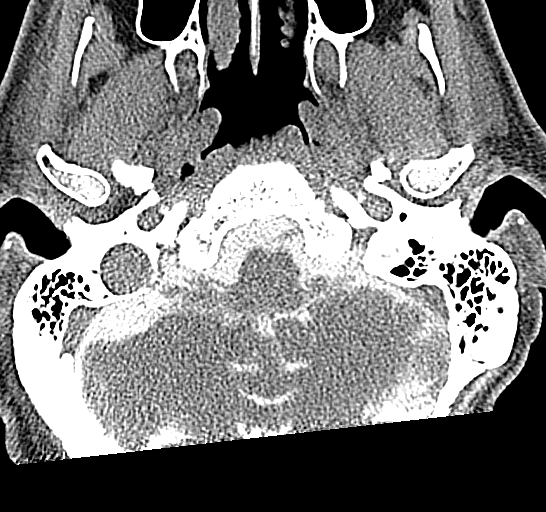
[im 102/119  bone]
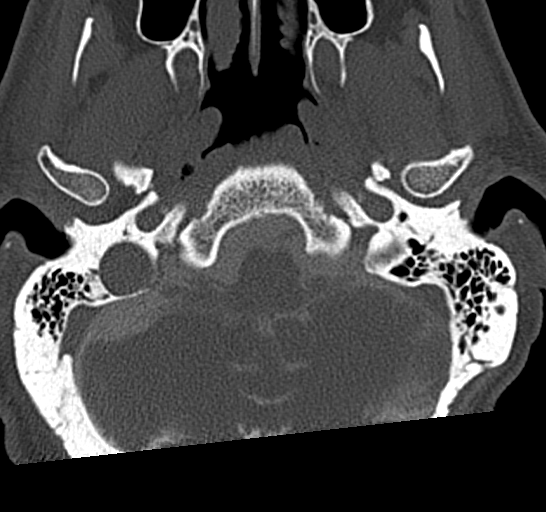

[13 of 33 positions shown; findings below may reference images not displayed]

FINDINGS: The intrathecal injection was performed by Dr. Oddo and dictated
separately

Alignment: Slight anterolisthesis is present at C4-5. There
straightening of the normal cervical lordosis. AP alignment is
otherwise anatomic.

Vertebrae: Vertebral body heights are preserved. No focal lytic or
blastic lesions are evident.

Cord: The cervical spinal cord is outlined without focal compromise.

Posterior Fossa and paraspinal tissues: Craniocervical junction is
normal.

Disc levels:

C2-3 common asymmetric left-sided facet hypertrophy is present.
There is uncovertebral spurring bilaterally. Severe left and
moderate right foraminal stenosis is stable.

C3-4: Facet hypertrophy is worse on the right. Severe right and
moderate left foraminal stenosis is stable.

C4-5: There is fusion across the disc space and facets. Mild left
foraminal narrowing is stable.

Go C5-6: Anterior and posterior fusion is solid. There is no
residual recurrent stenosis.

C6-7: Anterior and posterior fusion is solid. There is no residual
stenosis.

C7-T1: Uncovertebral and facet spurring is noted bilaterally.
Moderate left and mild right foraminal stenosis is stable.
IMPRESSION: 1. Similar appearance of severe left and moderate right foraminal
stenosis at C2-3.
2. Similar appearance of severe right and moderate left foraminal
stenosis at C3-4.
3. Solid fusion at C4-5 with mild left foraminal narrowing.
4. Solid fusion at C5-6 and C6-7 without significant stenosis.
5. Moderate left and mild right foraminal narrowing at C7-T1.

## 2018-09-09 IMAGING — RF DG MYELOGRAM CERVICAL
1 series · 8 of 8 positions shown · non-contrast
Comparison: none

CLINICAL DATA: Cervical disc disease with myelopathy.
TECHNIQUE: Contiguous axial images were obtained through the Cervical spine
after the intrathecal infusion of infusion. Coronal and sagittal
reconstructions were obtained of the axial image sets.

[Series 1: fluoro_myelogram_singleshot_bw · 0.18mm/px · 8 of 8 slices shown]
[im 1/8]
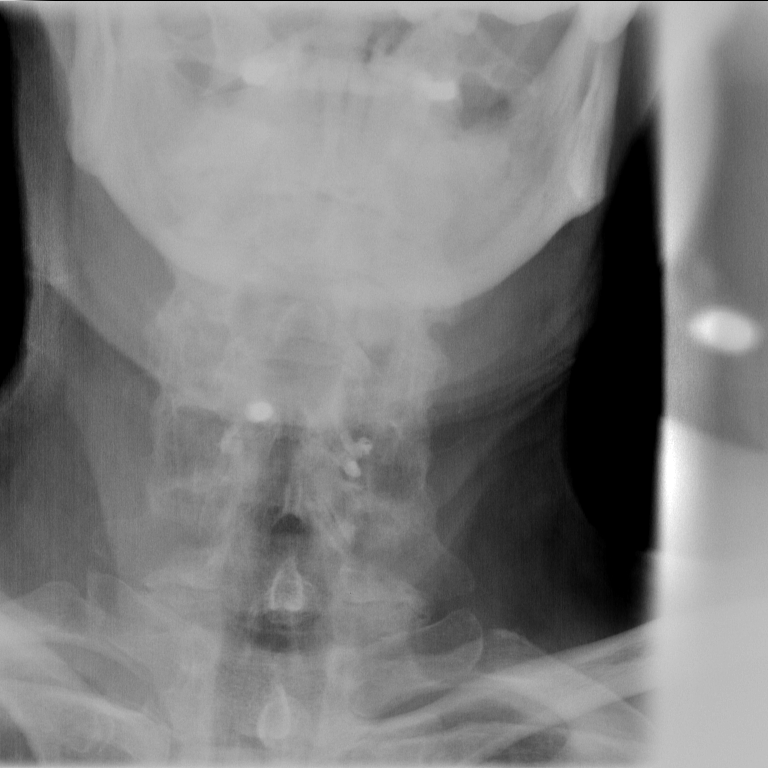
[im 2/8]
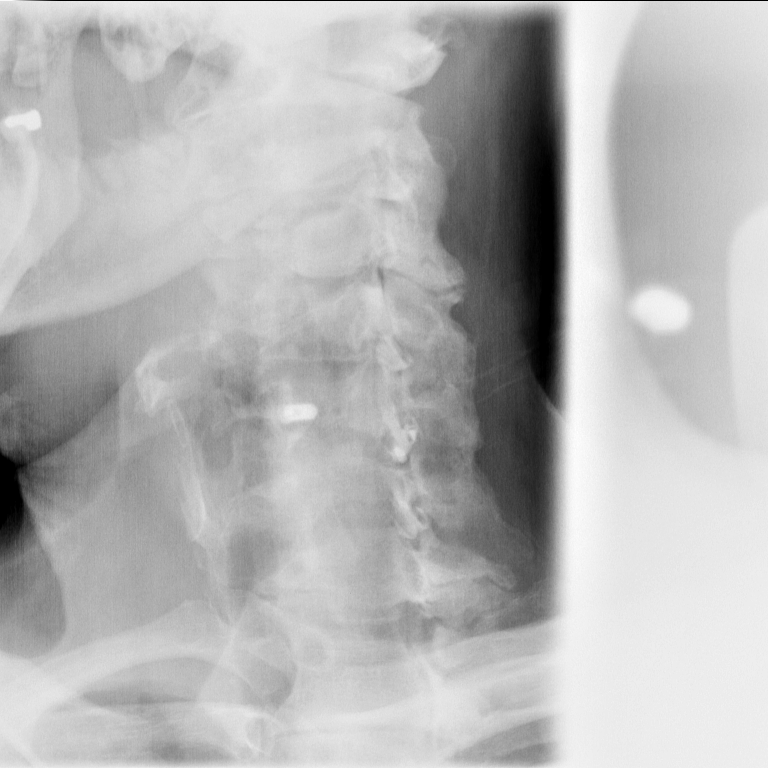
[im 3/8]
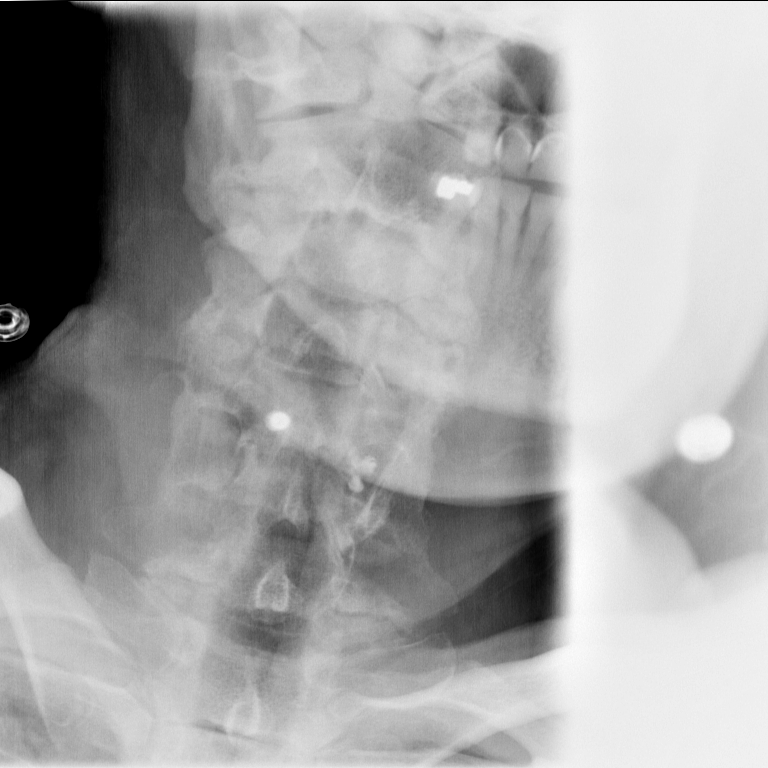
[im 4/8]
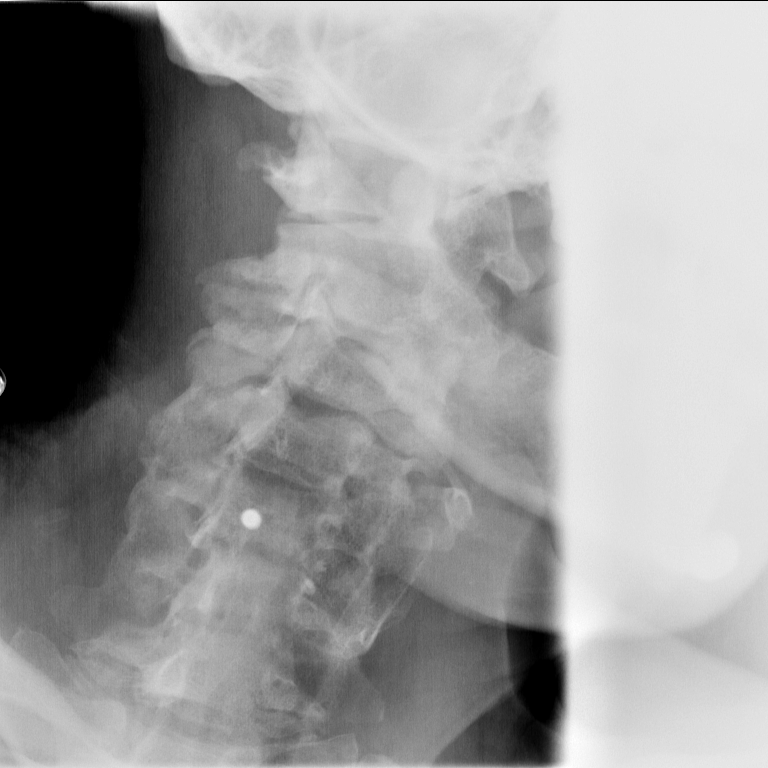
[im 5/8]
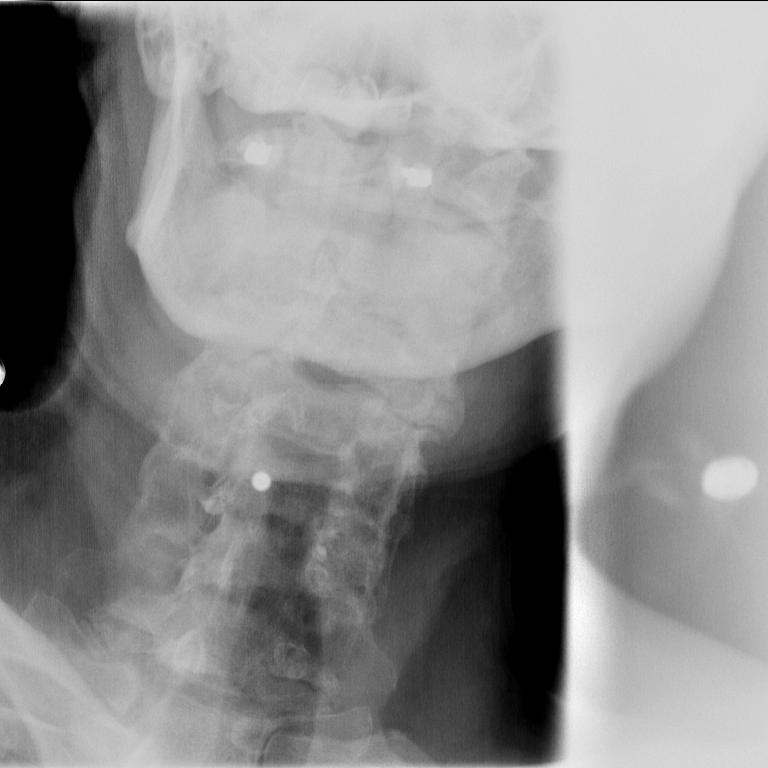
[im 6/8]
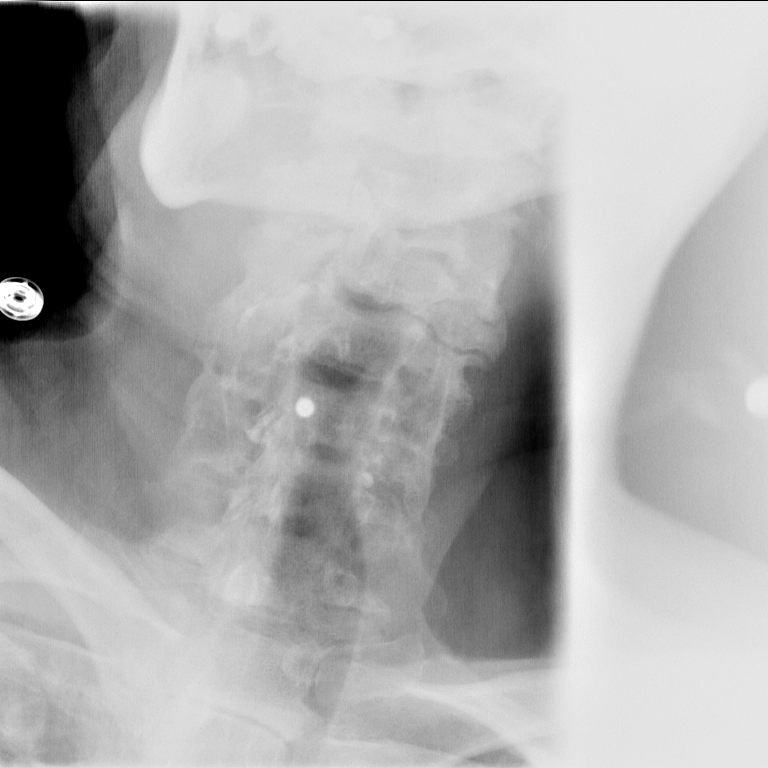
[im 7/8]
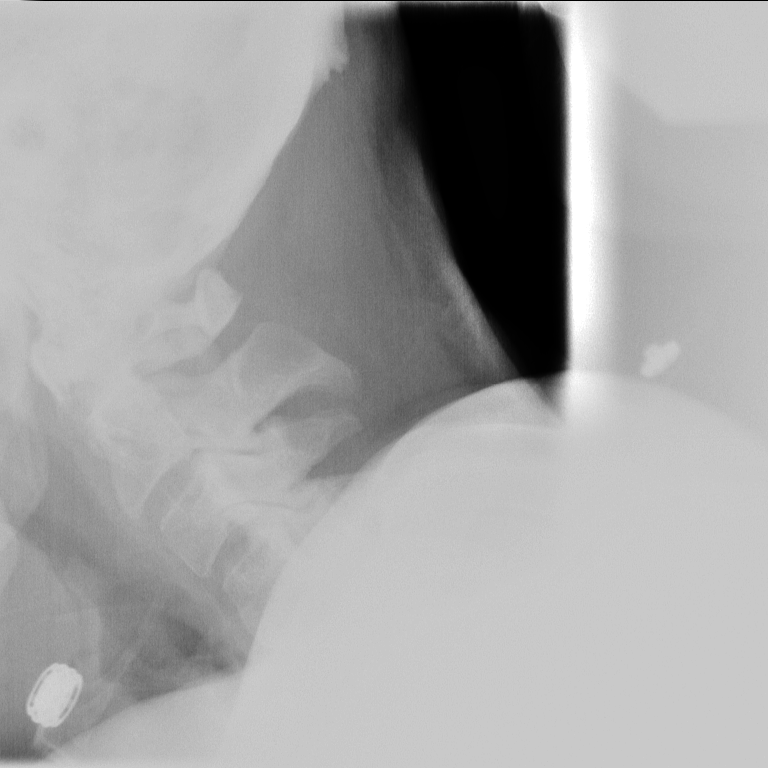
[im 8/8]
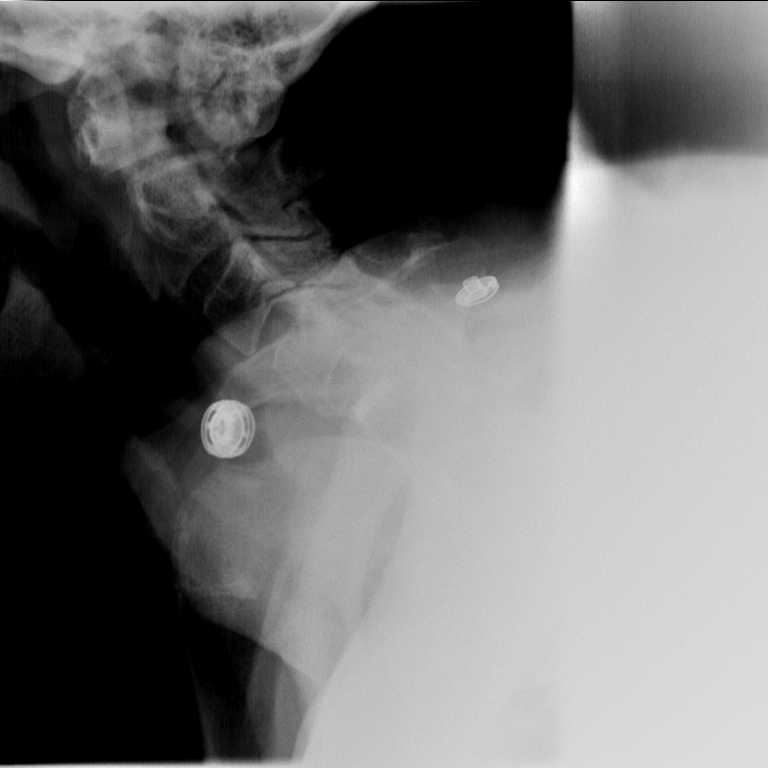

[8 of 8 positions shown; findings below may reference images not displayed]

FLUOROSCOPY TIME:  dictate in minutes and seconds

PROCEDURE:
LUMBAR PUNCTURE FOR CERVICAL MYELOGRAM

After discussing the risks and benefits of this procedure including
the possibility of headache and CSF leak informed consent was
obtained. The back was sterilely prepped and draped. For local
anesthesia with 1% lidocaine a 22 gauge spinal was advanced into the
lumbar canal at the L3-L4 level under fluoroscopic guidance. 10 cc
of Isovue-M 300 administered. Cervical myelogram obtained. Exam was
very limited as the patient experienced a mild vagal reaction and
had to stop the exam. Patient was monitored and remained in stable
condition. Patient was transferred to CT for further studies.
Following CT the patient was transferred to special is recovery in
stable condition.

I personally performed the lumbar puncture and administered the
intrathecal contrast. I also personally supervised acquisition of
the myelogram images.
FINDINGS: CERVICAL MYELOGRAM FINDINGS:

Successful injection for new cervical CT myelogram as above. Exam
was limited as described above. Diffuse degenerative change.
Reference is made to postmyelogram CT report.
IMPRESSION: Successful cervical myelogram.

## 2018-09-20 DIAGNOSIS — M47816 Spondylosis without myelopathy or radiculopathy, lumbar region: Secondary | ICD-10-CM | POA: Diagnosis not present

## 2018-09-20 DIAGNOSIS — Z79899 Other long term (current) drug therapy: Secondary | ICD-10-CM | POA: Diagnosis not present

## 2018-09-20 DIAGNOSIS — G894 Chronic pain syndrome: Secondary | ICD-10-CM | POA: Diagnosis not present

## 2018-09-26 DIAGNOSIS — R110 Nausea: Secondary | ICD-10-CM | POA: Diagnosis not present

## 2018-09-26 DIAGNOSIS — G44029 Chronic cluster headache, not intractable: Secondary | ICD-10-CM | POA: Diagnosis not present

## 2018-09-26 DIAGNOSIS — M2550 Pain in unspecified joint: Secondary | ICD-10-CM | POA: Diagnosis not present

## 2018-09-28 DIAGNOSIS — G4700 Insomnia, unspecified: Secondary | ICD-10-CM | POA: Diagnosis not present

## 2018-09-28 DIAGNOSIS — G44019 Episodic cluster headache, not intractable: Secondary | ICD-10-CM | POA: Diagnosis not present

## 2018-10-05 ENCOUNTER — Ambulatory Visit: Payer: PPO | Admitting: Cardiovascular Disease

## 2018-10-19 ENCOUNTER — Encounter: Payer: Self-pay | Admitting: Internal Medicine

## 2018-10-19 ENCOUNTER — Ambulatory Visit (INDEPENDENT_AMBULATORY_CARE_PROVIDER_SITE_OTHER): Payer: PPO | Admitting: Internal Medicine

## 2018-10-19 VITALS — BP 108/60 | HR 62 | Ht 71.0 in | Wt 182.0 lb

## 2018-10-19 DIAGNOSIS — R0602 Shortness of breath: Secondary | ICD-10-CM

## 2018-10-19 DIAGNOSIS — R002 Palpitations: Secondary | ICD-10-CM

## 2018-10-19 DIAGNOSIS — R5383 Other fatigue: Secondary | ICD-10-CM

## 2018-10-19 NOTE — Progress Notes (Signed)
New Outpatient Visit Date: 10/19/2018  Referring Provider: Darel Hong, MD [facility]  Chief Complaint: Fatigue  HPI:  Gregory Cervantes is a 62 y.o. male who is being seen today for the evaluation of palpitations at the request of Dr. Mable Paris. He has a history of pulmonary embolism and latent tuberculosis.  He presented to the emergency department in August complaining of intermittent palpitations and mild shortness of breath over the preceding week that began after surgery to the left wrist.  He was concerned about pulmonary embolism, the given remote history of PE.  Laboratory assessment and CTA of the chest was unrevealing, with no evidence of PE.  Mild aortic atherosclerosis was noted as well as centrilobular emphysema.  Today, Gregory Cervantes chief complaint is that he is tired and "doesn't feel well."  This has been present for at least 4 years and seems to be "getting worse every month."  He is on chronic pain medications and is concerned that they could be masking the underlying problem.  Interestingly, he feels more energetic after taking opiates.  He also complains of frequent night sweats.  He has been evaluated by multiple specialists, including rheumatology, neurology, and infectious disease without clear etiology established.  Inflammatory markers have been elevated, but a clear diagnosis has not been made.  Gregory Cervantes denies a history of heart disease.  He underwent an exercise tolerance test at [facility] in 2016 and believes that it was normal (records not immediately available).  He was seen by Dr. Ubaldo Glassing [facility]) in 2016; his note references a negative echo and stress test in 2014.  Gregory Cervantes experienced frequent palpitations shortly after left wrist surgery this summer (as noted above).  Palpitations have decreased in frequency and intensity since then and only happen on rare occasions now.  He notes mild shortness of breath with the palpitations,  which is also improving.  He denies chest pain, lightheadedness, orthopnea, PND, and edema.  He reports having undergone a sleep study ~15 years ago and believes that it was normal.  --------------------------------------------------------------------------------------------------  Cardiovascular History & Procedures: Cardiovascular Problems:  Palpitations  Risk Factors:  Aortic atherosclerosis (by CT), tobacco use, and male gender  Cath/PCI:  None  CV Surgery:  None  EP Procedures and Devices:  None  Non-Invasive Evaluation(s):  ETT (2016 - [facility]): Results not available; negative per patient.  Recent CV Pertinent Labs: Lab Results  Component Value Date   K 4.4 10/20/2018   K 4.2 10/01/2013   MG 2.0 10/06/2017   BUN 17 10/20/2018   BUN 10 10/06/2017   BUN 21 (H) 10/01/2013   CREATININE 1.07 10/20/2018   CREATININE 0.78 11/18/2016    --------------------------------------------------------------------------------------------------  Past Medical History:  Diagnosis Date  . Abnormal glucose 11/01/2013  . Arthritis   . BPH (benign prostatic hyperplasia)   . Chronic back pain   . Early satiety 11/18/2016  . Edema 03/28/2014  . Edema, pitting 03/28/2014  . Elevated WBC count 11/01/2013  . Esophageal candidiasis ([place]) 11/01/2013  . Headache   . History of pulmonary embolus (PE)   . Hyperglycemia, unspecified 11/01/2013  . Insomnia   . Loss of weight 11/18/2016  . Night sweat 08/19/2016  . Peripheral neuropathy   . Smoker 08/19/2016  . TB lung, latent 08/19/2016  . Weight gain 08/19/2016    Past Surgical History:  Procedure Laterality Date  . BACK SURGERY    . BRAIN SURGERY     x5- spinal fluid leaks  . CERVICAL  FUSION    . DG SHOULDER RIGHT COMPLETE     x6  . ESOPHAGOGASTRODUODENOSCOPY (EGD) WITH PROPOFOL N/A 06/15/2018   Procedure: ESOPHAGOGASTRODUODENOSCOPY (EGD) WITH PROPOFOL;  Surgeon: Toledo, Benay Pike, MD;  Location: [facility] ENDOSCOPY;  Service:  Gastroenterology;  Laterality: N/A;  . HAND SURGERY Left   . HERNIA REPAIR    . KNEE ARTHROSCOPY Left    x3  . PROSTATE SURGERY    . UMBILICAL HERNIA REPAIR N/A 08/17/2017   Procedure: HERNIA REPAIR UMBILICAL ADULT;  Surgeon: Leonie Green, MD;  Location: [facility] ORS;  Service: General;  Laterality: N/A;  . WISDOM TOOTH EXTRACTION      Current Meds  Medication Sig  . B Complex Vitamins (VITAMIN-B COMPLEX) TABS Take by mouth.  . gabapentin (NEURONTIN) 400 MG capsule Take 400 mg by mouth 5 (five) times daily as needed (for pain.).   Marland Kitchen Multiple Vitamin (MULTIVITAMIN WITH MINERALS) TABS tablet Take 1 tablet by mouth daily.  . ondansetron (ZOFRAN-ODT) 8 MG disintegrating tablet TAKE 1 TABLET (8 MG TOTAL) BY MOUTH EVERY 8 (EIGHT) HOURS AS NEEDED FOR NAUSEA FOR UP TO 10 DAYS.  . OxyCODONE HCl 30 MG TABA Take 30 mg by mouth 3 (three) times daily.  . SUMAtriptan (IMITREX) 6 MG/0.5ML SOLN injection Inject 0.6 mg into the skin 2 (two) times daily as needed (*may repeat dose in 2 hrs--max 2/24hr*for migraine/cluster headaches.).     Allergies: Patient has no known allergies.  Social History   Tobacco Use  . Smoking status: Current Every Day Smoker    Packs/day: 0.50    Types: Cigarettes  . Smokeless tobacco: Never Used  Substance Use Topics  . Alcohol use: No    Alcohol/week: 0.0 standard drinks    Comment: three times a year  . Drug use: No    Family History  Problem Relation Age of Onset  . Cancer Mother        pancreatic and lung  . Cancer Father        leukemia  . Anxiety disorder Sister   . Anxiety disorder Sister   . Anxiety disorder Sister     Review of Systems: A 12-system review of systems was performed and was negative except as noted in the HPI.  --------------------------------------------------------------------------------------------------  Physical Exam: BP 108/60 (BP Location: Right Arm, Patient Position: Sitting, Cuff Size: Normal)   Pulse 62   Ht 5'  11" (1.803 m)   Wt 182 lb (82.6 kg)   BMI 25.38 kg/m   General:  NAD. HEENT: No conjunctival pallor or scleral icterus. Moist mucous membranes. OP clear. Neck: Supple without lymphadenopathy, thyromegaly, JVD, or HJR. No carotid bruit. Lungs: Normal work of breathing. Clear to auscultation bilaterally without wheezes or crackles. Heart: Regular rate and rhythm without murmurs, rubs, or gallops. Non-displaced PMI. Abd: Bowel sounds present. Soft, NT/ND without hepatosplenomegaly Ext: No lower extremity edema. Radial, PT, and DP pulses are 2+ bilaterally Skin: Warm and dry without rash. Neuro: CNIII-XII intact. Strength and fine-touch sensation intact in upper and lower extremities bilaterally. Psych: Normal mood; blunted affect.  EKG:  NSR with possible LAE.  No significant abnormalities.  Lab Results  Component Value Date   WBC 7.2 10/20/2018   HGB 14.2 10/20/2018   HCT 42.5 10/20/2018   MCV 94.0 10/20/2018   PLT 196 10/20/2018    Lab Results  Component Value Date   NA 139 10/20/2018   K 4.4 10/20/2018   CL 104 10/20/2018   CO2 28 10/20/2018  BUN 17 10/20/2018   CREATININE 1.07 10/20/2018   GLUCOSE 104 (H) 10/20/2018   ALT 26 08/20/2017    No results found for: CHOL, HDL, LDLCALC, LDLDIRECT, TRIG, CHOLHDL   --------------------------------------------------------------------------------------------------  ASSESSMENT AND PLAN: Palpitations Uncertain etiology but improving without intervention.  We will obtain echocardiogram, given associated shortness of breath and fatigue.  We will defer ambulatory cardiac monitoring.  Fatigue and shortness of breath Long-standing and evaluated by multiple specialists over the last 4 years.  Remote cardiac workup by Dr. Ubaldo Glassing appears to have been unrevealing.  We will repeat an echocardiogram to exclude structural new abnormalities.  If symptoms persist, I would favor performing a cardiac CTA to exclude significant CAD.  However, I  suspect that his symptoms (fatigue, shortness of breath, and night sweats) are not primarily due to cardiac pathology.  Follow-up: Return to clinic in 6 weeks.  Nelva Bush, MD 10/20/2018 7:43 PM

## 2018-10-19 NOTE — Patient Instructions (Signed)
Medication Instructions:  Your physician recommends that you continue on your current medications as directed. Please refer to the Current Medication list given to you today.  If you need a refill on your cardiac medications before your next appointment, please call your pharmacy.   Lab work: none If you have labs (blood work) drawn today and your tests are completely normal, you will receive your results only by: Marland Kitchen MyChart Message (if you have MyChart) OR . A paper copy in the mail If you have any lab test that is abnormal or we need to change your treatment, we will call you to review the results.  Testing/Procedures: Your physician has requested that you have an echocardiogram. Echocardiography is a painless test that uses sound waves to create images of your heart. It provides your doctor with information about the size and shape of your heart and how well your heart's chambers and valves are working. This procedure takes approximately one hour. There are no restrictions for this procedure. You may get an IV, if needed, to receive an ultrasound enhancing agent through to better visualize your heart.   Follow-Up: At [facility], you and your health needs are our priority.  As part of our continuing mission to provide you with exceptional heart care, we have created designated Provider Care Teams.  These Care Teams include your primary Cardiologist (physician) and Advanced Practice Providers (APPs -  Physician Assistants and Nurse Practitioners) who all work together to provide you with the care you need, when you need it. You will need a follow up appointment in 6 weeks.  You may see  DR Harrell Gave END or one of the following Advanced Practice Providers on your designated Care Team:   Murray Hodgkins, NP Christell Faith, PA-C . Marrianne Mood, PA-C

## 2018-10-20 ENCOUNTER — Other Ambulatory Visit: Payer: Self-pay

## 2018-10-20 ENCOUNTER — Emergency Department
Admission: EM | Admit: 2018-10-20 | Discharge: 2018-10-20 | Discharge status: Against Medical Advice | Payer: PPO | Attending: Emergency Medicine | Admitting: Emergency Medicine

## 2018-10-20 DIAGNOSIS — R002 Palpitations: Secondary | ICD-10-CM | POA: Insufficient documentation

## 2018-10-20 DIAGNOSIS — R531 Weakness: Secondary | ICD-10-CM | POA: Insufficient documentation

## 2018-10-20 DIAGNOSIS — Z5321 Procedure and treatment not carried out due to patient leaving prior to being seen by health care provider: Secondary | ICD-10-CM | POA: Diagnosis not present

## 2018-10-20 DIAGNOSIS — R0602 Shortness of breath: Secondary | ICD-10-CM | POA: Insufficient documentation

## 2018-10-20 LAB — URINALYSIS, COMPLETE (UACMP) WITH MICROSCOPIC
BACTERIA UA: NONE SEEN
Bilirubin Urine: NEGATIVE
GLUCOSE, UA: NEGATIVE mg/dL
HGB URINE DIPSTICK: NEGATIVE
KETONES UR: NEGATIVE mg/dL
LEUKOCYTES UA: NEGATIVE
NITRITE: NEGATIVE
PH: 5 (ref 5.0–8.0)
PROTEIN: 100 mg/dL — AB
Specific Gravity, Urine: 1.024 (ref 1.005–1.030)
Squamous Epithelial / LPF: NONE SEEN (ref 0–5)

## 2018-10-20 LAB — BASIC METABOLIC PANEL
ANION GAP: 7 (ref 5–15)
BUN: 17 mg/dL (ref 8–23)
CHLORIDE: 104 mmol/L (ref 98–111)
CO2: 28 mmol/L (ref 22–32)
Calcium: 9.1 mg/dL (ref 8.9–10.3)
Creatinine, Ser: 1.07 mg/dL (ref 0.61–1.24)
GFR calc non Af Amer: >60 mL/min (ref >60)
Glucose, Bld: 104 mg/dL — ABNORMAL HIGH (ref 70–99)
POTASSIUM: 4.4 mmol/L (ref 3.5–5.1)
SODIUM: 139 mmol/L (ref 135–145)

## 2018-10-20 LAB — CBC
HCT: 42.5 % (ref 39.0–52.0)
HEMOGLOBIN: 14.2 g/dL (ref 13.0–17.0)
MCH: 31.4 pg (ref 26.0–34.0)
MCHC: 33.4 g/dL (ref 30.0–36.0)
MCV: 94 fL (ref 80.0–100.0)
NRBC: 0 % (ref 0.0–0.2)
Platelets: 196 10*3/uL (ref 150–400)
RBC: 4.52 MIL/uL (ref 4.22–5.81)
RDW: 12.9 % (ref 11.5–15.5)
WBC: 7.2 10*3/uL (ref 4.0–10.5)

## 2018-10-20 NOTE — ED Triage Notes (Signed)
Pt c/o increased weakness, dizziness for the past 2 weeks. States today while changing the air filter in his car he noticed his heart racing but slowed down before he arrived to the ED.

## 2018-10-20 NOTE — ED Notes (Signed)
Pt refusing to have VS completed and sign AMA, pt does not wish to wait any longer.

## 2018-10-23 DIAGNOSIS — M2550 Pain in unspecified joint: Secondary | ICD-10-CM | POA: Diagnosis not present

## 2018-10-23 DIAGNOSIS — M1990 Unspecified osteoarthritis, unspecified site: Secondary | ICD-10-CM | POA: Diagnosis not present

## 2018-10-23 DIAGNOSIS — R110 Nausea: Secondary | ICD-10-CM | POA: Diagnosis not present

## 2018-10-23 DIAGNOSIS — M129 Arthropathy, unspecified: Secondary | ICD-10-CM | POA: Diagnosis not present

## 2018-10-23 DIAGNOSIS — Z86718 Personal history of other venous thrombosis and embolism: Secondary | ICD-10-CM | POA: Diagnosis not present

## 2018-10-23 DIAGNOSIS — F17210 Nicotine dependence, cigarettes, uncomplicated: Secondary | ICD-10-CM | POA: Diagnosis not present

## 2018-10-23 DIAGNOSIS — R51 Headache: Secondary | ICD-10-CM | POA: Diagnosis not present

## 2018-10-23 DIAGNOSIS — R42 Dizziness and giddiness: Secondary | ICD-10-CM | POA: Diagnosis not present

## 2018-10-23 DIAGNOSIS — G44029 Chronic cluster headache, not intractable: Secondary | ICD-10-CM | POA: Diagnosis not present

## 2018-10-23 DIAGNOSIS — H5789 Other specified disorders of eye and adnexa: Secondary | ICD-10-CM | POA: Diagnosis not present

## 2018-11-03 ENCOUNTER — Other Ambulatory Visit: Payer: PPO

## 2018-11-18 DIAGNOSIS — J3089 Other allergic rhinitis: Secondary | ICD-10-CM | POA: Diagnosis not present

## 2018-11-18 DIAGNOSIS — L219 Seborrheic dermatitis, unspecified: Secondary | ICD-10-CM | POA: Diagnosis not present

## 2018-12-01 ENCOUNTER — Ambulatory Visit: Payer: PPO | Admitting: Nurse Practitioner

## 2018-12-01 DIAGNOSIS — M2550 Pain in unspecified joint: Secondary | ICD-10-CM | POA: Diagnosis not present

## 2018-12-01 DIAGNOSIS — R0989 Other specified symptoms and signs involving the circulatory and respiratory systems: Secondary | ICD-10-CM

## 2018-12-01 DIAGNOSIS — J309 Allergic rhinitis, unspecified: Secondary | ICD-10-CM | POA: Diagnosis not present

## 2018-12-02 ENCOUNTER — Encounter: Payer: Self-pay | Admitting: Nurse Practitioner

## 2018-12-20 DIAGNOSIS — M1812 Unilateral primary osteoarthritis of first carpometacarpal joint, left hand: Secondary | ICD-10-CM | POA: Diagnosis not present

## 2018-12-26 DIAGNOSIS — R52 Pain, unspecified: Secondary | ICD-10-CM | POA: Diagnosis not present

## 2018-12-26 DIAGNOSIS — M47816 Spondylosis without myelopathy or radiculopathy, lumbar region: Secondary | ICD-10-CM | POA: Diagnosis not present

## 2018-12-26 DIAGNOSIS — M5136 Other intervertebral disc degeneration, lumbar region: Secondary | ICD-10-CM | POA: Diagnosis not present

## 2018-12-26 DIAGNOSIS — R768 Other specified abnormal immunological findings in serum: Secondary | ICD-10-CM | POA: Diagnosis not present

## 2018-12-26 DIAGNOSIS — G894 Chronic pain syndrome: Secondary | ICD-10-CM | POA: Diagnosis not present

## 2018-12-26 DIAGNOSIS — M160 Bilateral primary osteoarthritis of hip: Secondary | ICD-10-CM | POA: Diagnosis not present

## 2018-12-26 DIAGNOSIS — M2550 Pain in unspecified joint: Secondary | ICD-10-CM | POA: Diagnosis not present

## 2019-01-17 DIAGNOSIS — G4700 Insomnia, unspecified: Secondary | ICD-10-CM | POA: Diagnosis not present

## 2019-01-17 DIAGNOSIS — G44019 Episodic cluster headache, not intractable: Secondary | ICD-10-CM | POA: Diagnosis not present

## 2019-01-24 DIAGNOSIS — J3489 Other specified disorders of nose and nasal sinuses: Secondary | ICD-10-CM | POA: Diagnosis not present

## 2019-01-24 DIAGNOSIS — R42 Dizziness and giddiness: Secondary | ICD-10-CM | POA: Diagnosis not present

## 2019-01-24 DIAGNOSIS — Z79899 Other long term (current) drug therapy: Secondary | ICD-10-CM | POA: Diagnosis not present

## 2019-01-24 DIAGNOSIS — H93A9 Pulsatile tinnitus, unspecified ear: Secondary | ICD-10-CM | POA: Diagnosis not present

## 2019-01-24 DIAGNOSIS — H903 Sensorineural hearing loss, bilateral: Secondary | ICD-10-CM | POA: Diagnosis not present

## 2019-01-24 DIAGNOSIS — R5382 Chronic fatigue, unspecified: Secondary | ICD-10-CM | POA: Diagnosis not present

## 2019-02-02 DIAGNOSIS — R5382 Chronic fatigue, unspecified: Secondary | ICD-10-CM | POA: Diagnosis not present

## 2019-02-02 DIAGNOSIS — E2749 Other adrenocortical insufficiency: Secondary | ICD-10-CM | POA: Diagnosis not present

## 2019-02-20 DIAGNOSIS — H93A9 Pulsatile tinnitus, unspecified ear: Secondary | ICD-10-CM | POA: Diagnosis not present

## 2019-02-21 DIAGNOSIS — R5383 Other fatigue: Secondary | ICD-10-CM | POA: Diagnosis not present

## 2019-02-21 DIAGNOSIS — F410 Panic disorder [episodic paroxysmal anxiety] without agoraphobia: Secondary | ICD-10-CM | POA: Diagnosis not present

## 2019-02-21 DIAGNOSIS — N410 Acute prostatitis: Secondary | ICD-10-CM | POA: Diagnosis not present

## 2019-02-21 DIAGNOSIS — R399 Unspecified symptoms and signs involving the genitourinary system: Secondary | ICD-10-CM | POA: Diagnosis not present

## 2019-03-01 DIAGNOSIS — R5381 Other malaise: Secondary | ICD-10-CM | POA: Diagnosis not present

## 2019-03-01 DIAGNOSIS — R5383 Other fatigue: Secondary | ICD-10-CM | POA: Diagnosis not present

## 2019-03-02 DIAGNOSIS — H93A9 Pulsatile tinnitus, unspecified ear: Secondary | ICD-10-CM | POA: Diagnosis not present

## 2019-03-02 DIAGNOSIS — H93A1 Pulsatile tinnitus, right ear: Secondary | ICD-10-CM | POA: Diagnosis not present

## 2019-04-03 ENCOUNTER — Ambulatory Visit: Payer: PPO | Admitting: Urology

## 2019-04-16 DIAGNOSIS — G4733 Obstructive sleep apnea (adult) (pediatric): Secondary | ICD-10-CM | POA: Diagnosis not present

## 2019-04-17 DIAGNOSIS — M25542 Pain in joints of left hand: Secondary | ICD-10-CM | POA: Diagnosis not present

## 2019-04-18 DIAGNOSIS — M1812 Unilateral primary osteoarthritis of first carpometacarpal joint, left hand: Secondary | ICD-10-CM | POA: Diagnosis not present

## 2019-04-18 DIAGNOSIS — M25542 Pain in joints of left hand: Secondary | ICD-10-CM | POA: Diagnosis not present

## 2019-04-18 DIAGNOSIS — M19042 Primary osteoarthritis, left hand: Secondary | ICD-10-CM | POA: Diagnosis not present

## 2019-05-18 ENCOUNTER — Ambulatory Visit: Payer: PPO | Admitting: Urology

## 2019-06-05 DIAGNOSIS — F1123 Opioid dependence with withdrawal: Secondary | ICD-10-CM | POA: Diagnosis not present

## 2019-06-05 DIAGNOSIS — G44019 Episodic cluster headache, not intractable: Secondary | ICD-10-CM | POA: Diagnosis not present

## 2019-06-19 DIAGNOSIS — E2749 Other adrenocortical insufficiency: Secondary | ICD-10-CM | POA: Diagnosis not present

## 2019-06-19 DIAGNOSIS — R5382 Chronic fatigue, unspecified: Secondary | ICD-10-CM | POA: Diagnosis not present

## 2019-06-19 DIAGNOSIS — G4733 Obstructive sleep apnea (adult) (pediatric): Secondary | ICD-10-CM | POA: Diagnosis not present

## 2019-06-20 DIAGNOSIS — E2749 Other adrenocortical insufficiency: Secondary | ICD-10-CM | POA: Diagnosis not present

## 2019-07-04 ENCOUNTER — Ambulatory Visit: Payer: PPO | Admitting: Urology

## 2019-07-04 ENCOUNTER — Encounter

## 2019-07-09 DIAGNOSIS — R42 Dizziness and giddiness: Secondary | ICD-10-CM | POA: Diagnosis not present

## 2019-07-11 DIAGNOSIS — G894 Chronic pain syndrome: Secondary | ICD-10-CM | POA: Diagnosis not present

## 2019-07-11 DIAGNOSIS — R5381 Other malaise: Secondary | ICD-10-CM | POA: Diagnosis not present

## 2019-07-11 DIAGNOSIS — R42 Dizziness and giddiness: Secondary | ICD-10-CM | POA: Diagnosis not present

## 2019-07-11 DIAGNOSIS — R5383 Other fatigue: Secondary | ICD-10-CM | POA: Diagnosis not present

## 2019-07-18 DIAGNOSIS — G253 Myoclonus: Secondary | ICD-10-CM | POA: Diagnosis not present

## 2019-07-18 DIAGNOSIS — G4733 Obstructive sleep apnea (adult) (pediatric): Secondary | ICD-10-CM | POA: Diagnosis not present

## 2019-07-18 DIAGNOSIS — G4719 Other hypersomnia: Secondary | ICD-10-CM | POA: Diagnosis not present

## 2019-07-18 DIAGNOSIS — G44019 Episodic cluster headache, not intractable: Secondary | ICD-10-CM | POA: Diagnosis not present

## 2019-08-10 ENCOUNTER — Ambulatory Visit: Payer: PPO | Admitting: Urology

## 2019-08-10 ENCOUNTER — Other Ambulatory Visit: Payer: Self-pay

## 2019-08-10 ENCOUNTER — Encounter: Payer: Self-pay | Admitting: Urology

## 2019-08-10 VITALS — BP 113/68 | HR 86 | Ht 71.0 in | Wt 173.0 lb

## 2019-08-10 DIAGNOSIS — N401 Enlarged prostate with lower urinary tract symptoms: Secondary | ICD-10-CM

## 2019-08-10 DIAGNOSIS — Z87438 Personal history of other diseases of male genital organs: Secondary | ICD-10-CM | POA: Diagnosis not present

## 2019-08-10 DIAGNOSIS — R5383 Other fatigue: Secondary | ICD-10-CM | POA: Diagnosis not present

## 2019-08-10 DIAGNOSIS — N410 Acute prostatitis: Secondary | ICD-10-CM | POA: Diagnosis not present

## 2019-08-10 LAB — URINALYSIS, COMPLETE
Bilirubin, UA: NEGATIVE
Glucose, UA: NEGATIVE
Ketones, UA: NEGATIVE
Leukocytes,UA: NEGATIVE
Nitrite, UA: NEGATIVE
Protein,UA: NEGATIVE
Specific Gravity, UA: >1.03 — ABNORMAL HIGH (ref 1.005–1.030)
Urobilinogen, Ur: 0.2 mg/dL (ref 0.2–1.0)
pH, UA: 5 (ref 5.0–7.5)

## 2019-08-10 LAB — MICROSCOPIC EXAMINATION: Bacteria, UA: NONE SEEN

## 2019-08-10 NOTE — Progress Notes (Signed)
08/10/2019 4:33 PM   Gregory Cervantes 08-15-1956 [id number]  Referring provider: [place], [place] [address] [place],  [place] [address]  Chief Complaint  Patient presents with  . Prostatitis    HPI: Gregory Cervantes is a 63 y.o. male seen at the request of Dr. [name] for acute bacterial prostatitis.  He was seen on 02/21/2019 complaining of urinary frequency, intermittent dysuria and decreased force and caliber of urinary stream.  He denied fever, chills, nausea or vomiting.  His urinalysis was completely normal.  He was treated empirically with a 30-day course of Septra and started on tamsulosin.  He does relate to positive urine cultures in the past and on review of epic he did have E. coli and enterococcal UTIs in 2015.  A PSA July 2018 was 0.21.  He remains on tamsulosin only has mild lower urinary tract symptoms.  He also complains of significant fatigue which is felt related to depression.  His evaluation has been negative.  He had a testosterone level in 2017 which was low normal at 269.  He has a history of BPH and a heat based procedure to his prostate approximately 15 years ago while living in [place].  He thinks this may have been  microwave thermotherapy which he felt was beneficial.   PMH: Past Medical History:  Diagnosis Date  . Abnormal glucose 11/01/2013  . Arthritis   . BPH (benign prostatic hyperplasia)   . Chronic back pain   . Early satiety 11/18/2016  . Edema 03/28/2014  . Edema, pitting 03/28/2014  . Elevated WBC count 11/01/2013  . Esophageal candidiasis ([place]) 11/01/2013  . Headache   . History of pulmonary embolus (PE)   . Hyperglycemia, unspecified 11/01/2013  . Insomnia   . Loss of weight 11/18/2016  . Night sweat 08/19/2016  . Peripheral neuropathy   . Smoker 08/19/2016  . TB lung, latent 08/19/2016  . Weight gain 08/19/2016    Surgical History: Past Surgical History:  Procedure Laterality Date  . BACK SURGERY    . BRAIN SURGERY     x5-  spinal fluid leaks  . CERVICAL FUSION    . DG SHOULDER RIGHT COMPLETE     x6  . ESOPHAGOGASTRODUODENOSCOPY (EGD) WITH PROPOFOL N/A 06/15/2018   Procedure: ESOPHAGOGASTRODUODENOSCOPY (EGD) WITH PROPOFOL;  Surgeon: Toledo, Benay Pike, MD;  Location: [facility] ENDOSCOPY;  Service: Gastroenterology;  Laterality: N/A;  . HAND SURGERY Left   . HERNIA REPAIR    . KNEE ARTHROSCOPY Left    x3  . PROSTATE SURGERY    . UMBILICAL HERNIA REPAIR N/A 08/17/2017   Procedure: HERNIA REPAIR UMBILICAL ADULT;  Surgeon: Leonie Green, MD;  Location: [facility] ORS;  Service: General;  Laterality: N/A;  . WISDOM TOOTH EXTRACTION      Home Medications:  Allergies as of 08/10/2019   No Known Allergies     Medication List       Accurate as of August 10, 2019  4:33 PM. If you have any questions, ask your nurse or doctor.        fluticasone 50 MCG/ACT nasal spray Commonly known as: FLONASE 1 spray by Each Nare route daily.   gabapentin 400 MG capsule Commonly known as: NEURONTIN Take 400 mg by mouth 5 (five) times daily as needed (for pain.).   hydrocortisone 5 MG tablet Commonly known as: CORTEF Take 5 mg by mouth daily.   IBU 400 MG tablet Generic drug: ibuprofen   Imitrex 6 MG/0.5ML Soln injection Generic  drug: SUMAtriptan Inject 0.6 mg into the skin 2 (two) times daily as needed (*may repeat dose in 2 hrs--max 2/24hr*for migraine/cluster headaches.).   multivitamin with minerals Tabs tablet Take 1 tablet by mouth daily.   naloxone 0.4 MG/ML injection Commonly known as: NARCAN Inject 0.4 mg into the muscle every 2 (two) hours as needed (for opoid overuse.).   ondansetron 4 MG tablet Commonly known as: ZOFRAN Take by mouth.   ondansetron 8 MG disintegrating tablet Commonly known as: ZOFRAN-ODT TAKE 1 TABLET (8 MG TOTAL) BY MOUTH EVERY 8 (EIGHT) HOURS AS NEEDED FOR NAUSEA FOR UP TO 10 DAYS.   oxycodone 30 MG immediate release tablet Commonly known as: ROXICODONE   QUEtiapine 100 MG  tablet Commonly known as: SEROQUEL   tamsulosin 0.4 MG Caps capsule Commonly known as: FLOMAX Take by mouth.   verapamil 120 MG tablet Commonly known as: CALAN Take by mouth.   Vitamin-B Complex Tabs Take by mouth.       Allergies: No Known Allergies  Family History: Family History  Problem Relation Age of Onset  . Cancer Mother        pancreatic and lung  . Cancer Father        leukemia  . Anxiety disorder Sister   . Anxiety disorder Sister   . Anxiety disorder Sister     Social History:  reports that he has been smoking cigarettes. He has been smoking about 0.50 packs per day. He has never used smokeless tobacco. He reports that he does not drink alcohol or use drugs.  ROS: UROLOGY Frequent Urination?: No Hard to postpone urination?: No Burning/pain with urination?: No Get up at night to urinate?: No Leakage of urine?: No Urine stream starts and stops?: No Trouble starting stream?: No Do you have to strain to urinate?: No Blood in urine?: No Urinary tract infection?: No Sexually transmitted disease?: No Injury to kidneys or bladder?: No Painful intercourse?: No Weak stream?: No Erection problems?: No Penile pain?: No  Gastrointestinal Nausea?: Yes Vomiting?: No Indigestion/heartburn?: No Diarrhea?: No Constipation?: No  Constitutional Fever: No Night sweats?: Yes Weight loss?: No Fatigue?: No  Skin Skin rash/lesions?: No Itching?: No  Eyes Blurred vision?: No Double vision?: No  Ears/Nose/Throat Sore throat?: No Sinus problems?: Yes  Hematologic/Lymphatic Swollen glands?: No Easy bruising?: No  Cardiovascular Leg swelling?: No Chest pain?: No  Respiratory Cough?: No Shortness of breath?: No  Endocrine Excessive thirst?: No  Musculoskeletal Back pain?: Yes Joint pain?: Yes  Neurological Headaches?: Yes Dizziness?: Yes  Psychologic Depression?: No Anxiety?: Yes  Physical Exam: BP 113/68 (BP Location: Left Arm,  Patient Position: Sitting, Cuff Size: Normal)   Pulse 86   Ht 5\' 11"  (1.803 m)   Wt 173 lb (78.5 kg)   BMI 24.13 kg/m   Constitutional:  Alert and oriented, No acute distress. HEENT: [place] AT, moist mucus membranes.  Trachea midline, no masses. Cardiovascular: No clubbing, cyanosis, or edema. Respiratory: Normal respiratory effort, no increased work of breathing. GI: Abdomen is soft, nontender, nondistended, no abdominal masses GU: Phallus without lesions, testes descended bilaterally without masses or tenderness.  Normal size bilaterally.  Spermatic cord/epididymis palpably normal bilaterally.  Prostate 35 g, smooth without nodules. Lymph: No cervical or inguinal lymphadenopathy. Skin: No rashes, bruises or suspicious lesions. Neurologic: Grossly intact, no focal deficits, moving all 4 extremities. Psychiatric: Normal mood and affect.   Assessment & Plan:    - Lower urinary tract symptoms Most likely secondary to BPH.  His most recent  episode may have been an inflammatory prostatitis but would not be an acute bacterial prostatitis based on lack of systemic symptoms and normal urinalysis. He does have a history of bacterial UTI and we did discuss the possibility of chronic bacterial prostatitis however last episode is not consistent with this diagnosis.  I recommended he continue tamsulosin.  His last PSA was in 2018 and was drawn today.  - Fatigue Prior testosterone level 2017 was low normal.  It was repeated today along with an LH.   Abbie Sons, [place] [address], [place], [place] [address] [phone]

## 2019-08-11 ENCOUNTER — Ambulatory Visit (HOSPITAL_COMMUNITY): Payer: PPO | Attending: Cardiology

## 2019-08-11 DIAGNOSIS — R0602 Shortness of breath: Secondary | ICD-10-CM

## 2019-08-11 DIAGNOSIS — R5383 Other fatigue: Secondary | ICD-10-CM

## 2019-08-11 LAB — PSA: Prostate Specific Ag, Serum: 0.3 ng/mL (ref 0.0–4.0)

## 2019-08-11 LAB — LUTEINIZING HORMONE: LH: 4.2 m[IU]/mL (ref 1.7–8.6)

## 2019-08-11 LAB — TESTOSTERONE: Testosterone: 335 ng/dL (ref 264–916)

## 2019-08-15 ENCOUNTER — Telehealth: Payer: Self-pay | Admitting: *Deleted

## 2019-08-15 NOTE — Telephone Encounter (Signed)
-----   Message from Abbie Sons, MD sent at 08/15/2019  7:30 AM EDT ----- Testosterone level normal at 335

## 2019-08-15 NOTE — Telephone Encounter (Signed)
Patient returned phone call for results. Can be reached at [phone]

## 2019-08-15 NOTE — Telephone Encounter (Signed)
No answer. Left message to call back.   

## 2019-08-15 NOTE — Telephone Encounter (Signed)
Left message for patient to call us back.  

## 2019-08-15 NOTE — Telephone Encounter (Signed)
-----   Message from Nelva Bush, MD sent at 08/12/2019 12:12 PM EDT ----- Please let Gregory Cervantes know that his echo shows that his heart is contracting well.  There is mild thickening of the aortic valve and mild enlargement of the thoracic aorta.  I do not believe that these findings explain his symptoms that we discussed at his last visit in 10/2018.  He should have cross-sectional imaging of his aorta at some point for further evaluation.  However, given that it has been 9 months since he was last seen, I recommend that he follow up with me or an APP at his convenience to reassess his symptoms and discuss utility of cardiac CTA (which will also allow for evaluation of the thoracic aorta).

## 2019-08-16 NOTE — Telephone Encounter (Signed)
Called patient and he verbalized understanding of Dr Darnelle Bos recommendations. States he has not had the symptoms today or at this moment. I advised if/when he does, going to the Emergency room is best advised. He is also aware that traveling may not be advised if symptoms are going on. Patient verbalized understanding.

## 2019-08-16 NOTE — Telephone Encounter (Signed)
If the patient is having new/worsening symptoms, he should proceed to the ED. We can try to work him in earlier than 9/17 if something opens up. However, if he is concerned about acute symptoms, I advise against traveling to [place] this weekend.  Chris Iliany Losier

## 2019-08-16 NOTE — Telephone Encounter (Signed)
Patient calling me back. He verbalized understanding of the results. We scheduled appointment for 08/31/19 at 0830 with Christell Faith, PA. We were about to hang up the phone and he said "Well I'll see you if I'm not dead by then." I inquired of what he meant. Stated he's been having pounding in the center of his chest with activity. It may last about 15 min and occur twice a day but not everyday. He also is sweating more than usual especially when this happens.  Advised I will let Dr End know and see if appointment timing is adequate and if any further recommendations are needed at this time. Patient says he's traveling to [place] on 9/6 and may be gone all next week. I went ahead and put him on the waiting list just in case.

## 2019-08-16 NOTE — Telephone Encounter (Signed)
No answer. Left message to call back.   

## 2019-08-19 ENCOUNTER — Emergency Department
Admission: EM | Admit: 2019-08-19 | Discharge: 2019-08-19 | Disposition: A | Discharge status: Other Acute Inpatient Hospital | Payer: PPO | Attending: Student in an Organized Health Care Education/Training Program | Admitting: Student in an Organized Health Care Education/Training Program

## 2019-08-19 ENCOUNTER — Emergency Department: Payer: PPO

## 2019-08-19 DIAGNOSIS — G936 Cerebral edema: Secondary | ICD-10-CM | POA: Diagnosis not present

## 2019-08-19 DIAGNOSIS — N179 Acute kidney failure, unspecified: Secondary | ICD-10-CM | POA: Diagnosis not present

## 2019-08-19 DIAGNOSIS — X749XXA Intentional self-harm by unspecified firearm discharge, initial encounter: Secondary | ICD-10-CM | POA: Insufficient documentation

## 2019-08-19 DIAGNOSIS — I959 Hypotension, unspecified: Secondary | ICD-10-CM | POA: Diagnosis not present

## 2019-08-19 DIAGNOSIS — S0103XA Puncture wound without foreign body of scalp, initial encounter: Secondary | ICD-10-CM | POA: Diagnosis present

## 2019-08-19 DIAGNOSIS — Y249XXA Unspecified firearm discharge, undetermined intent, initial encounter: Secondary | ICD-10-CM | POA: Diagnosis not present

## 2019-08-19 DIAGNOSIS — M47814 Spondylosis without myelopathy or radiculopathy, thoracic region: Secondary | ICD-10-CM | POA: Diagnosis not present

## 2019-08-19 DIAGNOSIS — S199XXA Unspecified injury of neck, initial encounter: Secondary | ICD-10-CM | POA: Diagnosis not present

## 2019-08-19 DIAGNOSIS — S0190XA Unspecified open wound of unspecified part of head, initial encounter: Secondary | ICD-10-CM | POA: Diagnosis not present

## 2019-08-19 DIAGNOSIS — M47816 Spondylosis without myelopathy or radiculopathy, lumbar region: Secondary | ICD-10-CM | POA: Diagnosis not present

## 2019-08-19 DIAGNOSIS — Z9911 Dependence on respirator [ventilator] status: Secondary | ICD-10-CM | POA: Diagnosis not present

## 2019-08-19 DIAGNOSIS — G8929 Other chronic pain: Secondary | ICD-10-CM | POA: Diagnosis not present

## 2019-08-19 DIAGNOSIS — G8250 Quadriplegia, unspecified: Secondary | ICD-10-CM | POA: Diagnosis not present

## 2019-08-19 DIAGNOSIS — M47812 Spondylosis without myelopathy or radiculopathy, cervical region: Secondary | ICD-10-CM | POA: Diagnosis not present

## 2019-08-19 DIAGNOSIS — Z4682 Encounter for fitting and adjustment of non-vascular catheter: Secondary | ICD-10-CM | POA: Diagnosis not present

## 2019-08-19 DIAGNOSIS — R918 Other nonspecific abnormal finding of lung field: Secondary | ICD-10-CM | POA: Diagnosis not present

## 2019-08-19 DIAGNOSIS — Z515 Encounter for palliative care: Secondary | ICD-10-CM | POA: Diagnosis not present

## 2019-08-19 DIAGNOSIS — S061X9A Traumatic cerebral edema with loss of consciousness of unspecified duration, initial encounter: Secondary | ICD-10-CM | POA: Diagnosis not present

## 2019-08-19 DIAGNOSIS — R579 Shock, unspecified: Secondary | ICD-10-CM | POA: Diagnosis not present

## 2019-08-19 DIAGNOSIS — M5136 Other intervertebral disc degeneration, lumbar region: Secondary | ICD-10-CM | POA: Diagnosis not present

## 2019-08-19 DIAGNOSIS — J9600 Acute respiratory failure, unspecified whether with hypoxia or hypercapnia: Secondary | ICD-10-CM | POA: Diagnosis not present

## 2019-08-19 DIAGNOSIS — Z7951 Long term (current) use of inhaled steroids: Secondary | ICD-10-CM | POA: Diagnosis not present

## 2019-08-19 DIAGNOSIS — S020XXA Fracture of vault of skull, initial encounter for closed fracture: Secondary | ICD-10-CM | POA: Diagnosis not present

## 2019-08-19 DIAGNOSIS — S0285XA Fracture of orbit, unspecified, initial encounter for closed fracture: Secondary | ICD-10-CM | POA: Diagnosis not present

## 2019-08-19 DIAGNOSIS — Z86718 Personal history of other venous thrombosis and embolism: Secondary | ICD-10-CM | POA: Diagnosis not present

## 2019-08-19 DIAGNOSIS — R000 Tachycardia, unspecified: Secondary | ICD-10-CM | POA: Diagnosis not present

## 2019-08-19 DIAGNOSIS — Y9389 Activity, other specified: Secondary | ICD-10-CM | POA: Diagnosis not present

## 2019-08-19 DIAGNOSIS — F17210 Nicotine dependence, cigarettes, uncomplicated: Secondary | ICD-10-CM | POA: Insufficient documentation

## 2019-08-19 DIAGNOSIS — S299XXA Unspecified injury of thorax, initial encounter: Secondary | ICD-10-CM | POA: Diagnosis not present

## 2019-08-19 DIAGNOSIS — S0193XA Puncture wound without foreign body of unspecified part of head, initial encounter: Secondary | ICD-10-CM | POA: Diagnosis not present

## 2019-08-19 DIAGNOSIS — S3991XA Unspecified injury of abdomen, initial encounter: Secondary | ICD-10-CM | POA: Diagnosis not present

## 2019-08-19 DIAGNOSIS — Z66 Do not resuscitate: Secondary | ICD-10-CM | POA: Diagnosis not present

## 2019-08-19 DIAGNOSIS — E876 Hypokalemia: Secondary | ICD-10-CM | POA: Diagnosis not present

## 2019-08-19 DIAGNOSIS — S0193XD Puncture wound without foreign body of unspecified part of head, subsequent encounter: Secondary | ICD-10-CM | POA: Diagnosis not present

## 2019-08-19 DIAGNOSIS — S0180XA Unspecified open wound of other part of head, initial encounter: Secondary | ICD-10-CM | POA: Diagnosis not present

## 2019-08-19 DIAGNOSIS — Z981 Arthrodesis status: Secondary | ICD-10-CM | POA: Diagnosis not present

## 2019-08-19 DIAGNOSIS — S02119A Unspecified fracture of occiput, initial encounter for closed fracture: Secondary | ICD-10-CM | POA: Diagnosis not present

## 2019-08-19 DIAGNOSIS — W3400XA Accidental discharge from unspecified firearms or gun, initial encounter: Secondary | ICD-10-CM | POA: Diagnosis not present

## 2019-08-19 DIAGNOSIS — T794XXA Traumatic shock, initial encounter: Secondary | ICD-10-CM | POA: Diagnosis not present

## 2019-08-19 DIAGNOSIS — S3992XA Unspecified injury of lower back, initial encounter: Secondary | ICD-10-CM | POA: Diagnosis not present

## 2019-08-19 DIAGNOSIS — R4020 Unspecified coma: Secondary | ICD-10-CM | POA: Diagnosis not present

## 2019-08-19 DIAGNOSIS — Z79899 Other long term (current) drug therapy: Secondary | ICD-10-CM | POA: Insufficient documentation

## 2019-08-19 DIAGNOSIS — R402432 Glasgow coma scale score 3-8, at arrival to emergency department: Secondary | ICD-10-CM | POA: Diagnosis not present

## 2019-08-19 DIAGNOSIS — S066X9A Traumatic subarachnoid hemorrhage with loss of consciousness of unspecified duration, initial encounter: Secondary | ICD-10-CM | POA: Diagnosis not present

## 2019-08-19 DIAGNOSIS — S0183XA Puncture wound without foreign body of other part of head, initial encounter: Secondary | ICD-10-CM | POA: Diagnosis not present

## 2019-08-19 DIAGNOSIS — S3993XA Unspecified injury of pelvis, initial encounter: Secondary | ICD-10-CM | POA: Diagnosis not present

## 2019-08-19 DIAGNOSIS — Y999 Unspecified external cause status: Secondary | ICD-10-CM | POA: Diagnosis not present

## 2019-08-19 DIAGNOSIS — S06369A Traumatic hemorrhage of cerebrum, unspecified, with loss of consciousness of unspecified duration, initial encounter: Secondary | ICD-10-CM | POA: Diagnosis not present

## 2019-08-19 DIAGNOSIS — S0292XA Unspecified fracture of facial bones, initial encounter for closed fracture: Secondary | ICD-10-CM | POA: Diagnosis not present

## 2019-08-19 DIAGNOSIS — Y92481 Parking lot as the place of occurrence of the external cause: Secondary | ICD-10-CM | POA: Diagnosis not present

## 2019-08-19 DIAGNOSIS — S0291XA Unspecified fracture of skull, initial encounter for closed fracture: Secondary | ICD-10-CM | POA: Diagnosis not present

## 2019-08-19 DIAGNOSIS — Z86711 Personal history of pulmonary embolism: Secondary | ICD-10-CM | POA: Diagnosis not present

## 2019-08-19 DIAGNOSIS — G9382 Brain death: Secondary | ICD-10-CM | POA: Diagnosis not present

## 2019-08-19 DIAGNOSIS — D65 Disseminated intravascular coagulation [defibrination syndrome]: Secondary | ICD-10-CM | POA: Diagnosis not present

## 2019-08-19 DIAGNOSIS — S066X0A Traumatic subarachnoid hemorrhage without loss of consciousness, initial encounter: Secondary | ICD-10-CM | POA: Diagnosis not present

## 2019-08-19 DIAGNOSIS — I499 Cardiac arrhythmia, unspecified: Secondary | ICD-10-CM | POA: Diagnosis not present

## 2019-08-19 DIAGNOSIS — Z1810 Retained metal fragments, unspecified: Secondary | ICD-10-CM | POA: Diagnosis not present

## 2019-08-19 DIAGNOSIS — N170 Acute kidney failure with tubular necrosis: Secondary | ICD-10-CM | POA: Diagnosis not present

## 2019-08-19 MED ORDER — SODIUM CHLORIDE 0.9 % IV BOLUS
1000.0000 mL | Freq: Once | INTRAVENOUS | Status: AC
  Administered 2019-08-19: 16:00:00 1000 mL via INTRAVENOUS

## 2019-08-19 MED ORDER — NOREPINEPHRINE 4 MG/250ML-% IV SOLN
0.0000 ug/min | INTRAVENOUS | Status: DC
  Administered 2019-08-19: 5 ug/min via INTRAVENOUS

## 2019-08-19 MED ORDER — EPINEPHRINE 1 MG/10ML IJ SOSY
PREFILLED_SYRINGE | INTRAMUSCULAR | Status: AC
  Filled 2019-08-19: qty 50

## 2019-08-19 MED ORDER — LEVETIRACETAM IN NACL 1500 MG/100ML IV SOLN
1500.0000 mg | Freq: Once | INTRAVENOUS | Status: AC
  Administered 2019-08-19: 16:00:00 1500 mg via INTRAVENOUS
  Filled 2019-08-19: qty 100

## 2019-08-19 MED ORDER — ETOMIDATE 2 MG/ML IV SOLN
INTRAVENOUS | Status: AC | PRN
  Administered 2019-08-19: 20 mg via INTRAVENOUS

## 2019-08-19 MED ORDER — ROCURONIUM BROMIDE 50 MG/5ML IV SOLN
INTRAVENOUS | Status: AC | PRN
  Administered 2019-08-19: 100 mg via INTRAVENOUS

## 2019-08-19 MED ORDER — SUCCINYLCHOLINE CHLORIDE 20 MG/ML IJ SOLN
INTRAMUSCULAR | Status: AC | PRN
  Administered 2019-08-19: 100 mg via INTRAVENOUS

## 2019-08-19 MED ORDER — ATROPINE SULFATE 1 MG/ML IJ SOLN
INTRAMUSCULAR | Status: AC | PRN
  Administered 2019-08-19: 1 mg via INTRAVENOUS
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
## 2019-08-19 NOTE — ED Notes (Signed)
Pt intubated and no family to sign for consent for transport

## 2019-08-19 NOTE — ED Notes (Signed)
Pt pupils noted at at [facility] on R and pinpoint on L- R side is posturing in decorticate

## 2019-08-19 NOTE — ED Notes (Signed)
Pt intubated

## 2019-08-19 NOTE — ED Notes (Addendum)
Pt Hr dropped to 38- Dr Quentin Cornwall called to room- see [facility]
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
## 2019-08-19 NOTE — ED Notes (Signed)
Report was given to Caryl Asp, Therapist, sports at [facility]

## 2019-08-22 NOTE — Telephone Encounter (Signed)
Mail results.

## 2019-08-24 ENCOUNTER — Telehealth: Payer: Self-pay | Admitting: Internal Medicine

## 2019-08-24 NOTE — Telephone Encounter (Addendum)
Called Gregory Cervantes who is listed on DPR and next of kin to patient.  She would like to speak to provider regarding her dad's cardiology visit.  I informed her Dr. Saunders Revel was out of the office today and that I would speak to him and follow-up with her upon his return.  I also gave her information on how to request his medical records through HIM.  I offered by condolences to daughter and she was appreciative of phone call. Patient's daughter can be reached at [phone]

## 2019-08-24 NOTE — Telephone Encounter (Signed)
Patient daughter calling to inquire about recent conversation between patient and cardiologist. Patient is recently deceased and daughter would like to speak with patients doctor.

## 2019-08-24 NOTE — Telephone Encounter (Signed)
Spoke with Daughter Myriam Jacobson and gave number to HIM to request records.

## 2019-08-24 NOTE — Telephone Encounter (Signed)
I spoke with Mr. Clinkscales daughter, Myriam Jacobson, at length regarding his recent suicide and cardiology evaluation leading up to this.  We reviewed the findings and recommendations of his office visit with me in 10/2018, as well as the results of his recent echocardiogram and subsequent follow-up instructions, as documented in Epic.  She was appreciative for the call and will contact us if any further questions arise.  We will also assist with putting her in touch with medical records in order to obtain further information.  Nelva Bush, MD [facility] Pager: [phone]

## 2019-09-04 ENCOUNTER — Ambulatory Visit: Payer: PPO | Admitting: Physician Assistant

## 2019-09-14 DEATH — deceased
